# Patient Record
Sex: Male | Born: 1980
Health system: Southern US, Community
[De-identification: ages and names within clinical notes are randomized; demographics above are authoritative.]

## PROBLEM LIST (undated history)

## (undated) DIAGNOSIS — I10 Essential (primary) hypertension: Secondary | ICD-10-CM

## (undated) DIAGNOSIS — Z87442 Personal history of urinary calculi: Secondary | ICD-10-CM

## (undated) HISTORY — PX: CHOLECYSTECTOMY: SHX55

---

## 2009-12-26 ENCOUNTER — Emergency Department (HOSPITAL_COMMUNITY)
Admission: EM | Admit: 2009-12-26 | Discharge: 2009-12-26 | Payer: Self-pay | Source: Home / Self Care | Admitting: Emergency Medicine

## 2009-12-27 ENCOUNTER — Ambulatory Visit (HOSPITAL_BASED_OUTPATIENT_CLINIC_OR_DEPARTMENT_OTHER)
Admission: RE | Admit: 2009-12-27 | Discharge: 2009-12-27 | Payer: Self-pay | Source: Home / Self Care | Attending: Internal Medicine | Admitting: Internal Medicine

## 2009-12-28 ENCOUNTER — Ambulatory Visit (HOSPITAL_COMMUNITY)
Admission: RE | Admit: 2009-12-28 | Discharge: 2009-12-28 | Payer: Self-pay | Source: Home / Self Care | Attending: Internal Medicine | Admitting: Internal Medicine

## 2010-03-28 LAB — POCT CARDIAC MARKERS
CKMB, poc: <1 ng/mL — ABNORMAL LOW (ref 1.0–8.0)
CKMB, poc: <1 ng/mL — ABNORMAL LOW (ref 1.0–8.0)
Myoglobin, poc: 25.6 ng/mL (ref 12–200)
Myoglobin, poc: 39.7 ng/mL (ref 12–200)
Troponin i, poc: <0.05 ng/mL (ref 0.00–0.09)
Troponin i, poc: <0.05 ng/mL (ref 0.00–0.09)

## 2010-03-28 LAB — CBC
HCT: 47.3 % (ref 39.0–52.0)
Hemoglobin: 16.3 g/dL (ref 13.0–17.0)
MCV: 89.4 fL (ref 78.0–100.0)
Platelets: 332 10*3/uL (ref 150–400)
RBC: 5.29 MIL/uL (ref 4.22–5.81)
WBC: 7.2 10*3/uL (ref 4.0–10.5)

## 2010-03-28 LAB — URINALYSIS, ROUTINE W REFLEX MICROSCOPIC
Hgb urine dipstick: NEGATIVE
Protein, ur: NEGATIVE mg/dL
pH: 5 (ref 5.0–8.0)

## 2010-03-28 LAB — DIFFERENTIAL
Basophils Absolute: 0 10*3/uL (ref 0.0–0.1)
Basophils Relative: 0 % (ref 0–1)
Eosinophils Absolute: 0.1 10*3/uL (ref 0.0–0.7)
Eosinophils Relative: 1 % (ref 0–5)
Lymphocytes Relative: 34 % (ref 12–46)
Monocytes Absolute: 0.6 10*3/uL (ref 0.1–1.0)
Neutrophils Relative %: 56 % (ref 43–77)

## 2010-03-28 LAB — POCT I-STAT, CHEM 8
HCT: 50 % (ref 39.0–52.0)
Hemoglobin: 17 g/dL (ref 13.0–17.0)
Sodium: 142 mEq/L (ref 135–145)
TCO2: 27 mmol/L (ref 0–100)

## 2014-03-17 ENCOUNTER — Ambulatory Visit (HOSPITAL_BASED_OUTPATIENT_CLINIC_OR_DEPARTMENT_OTHER)
Admission: RE | Admit: 2014-03-17 | Discharge: 2014-03-17 | Disposition: A | Discharge status: Home / Self Care | Payer: 59 | Source: Ambulatory Visit | Attending: Internal Medicine | Admitting: Internal Medicine

## 2014-03-17 ENCOUNTER — Other Ambulatory Visit (HOSPITAL_BASED_OUTPATIENT_CLINIC_OR_DEPARTMENT_OTHER): Payer: Self-pay | Admitting: Internal Medicine

## 2014-03-17 DIAGNOSIS — R109 Unspecified abdominal pain: Secondary | ICD-10-CM

## 2014-03-17 DIAGNOSIS — R1011 Right upper quadrant pain: Secondary | ICD-10-CM | POA: Diagnosis present

## 2014-03-17 DIAGNOSIS — R110 Nausea: Secondary | ICD-10-CM | POA: Insufficient documentation

## 2016-02-03 DIAGNOSIS — L509 Urticaria, unspecified: Secondary | ICD-10-CM | POA: Diagnosis not present

## 2016-02-03 DIAGNOSIS — I10 Essential (primary) hypertension: Secondary | ICD-10-CM | POA: Diagnosis not present

## 2016-02-03 DIAGNOSIS — R5383 Other fatigue: Secondary | ICD-10-CM | POA: Diagnosis not present

## 2016-02-03 DIAGNOSIS — R5381 Other malaise: Secondary | ICD-10-CM | POA: Diagnosis not present

## 2016-03-06 DIAGNOSIS — L718 Other rosacea: Secondary | ICD-10-CM | POA: Diagnosis not present

## 2016-04-02 MED FILL — LOSARTAN POTASSIUM 50 MG TA: 50 | 90 days supply | Qty: 90 | Fill #0

## 2016-05-15 ENCOUNTER — Telehealth: Payer: 59 | Admitting: Family

## 2016-05-15 DIAGNOSIS — H1012 Acute atopic conjunctivitis, left eye: Secondary | ICD-10-CM

## 2016-05-15 MED ORDER — POLYMYXIN B-TRIMETHOPRIM 10000-0.1 UNIT/ML-% OP SOLN: 1.0000 [drp] | OPHTHALMIC | 0 refills | Status: DC

## 2016-05-15 NOTE — Progress Notes (Signed)

## 2016-05-24 DIAGNOSIS — H10812 Pingueculitis, left eye: Secondary | ICD-10-CM | POA: Diagnosis not present

## 2016-05-24 DIAGNOSIS — H40023 Open angle with borderline findings, high risk, bilateral: Secondary | ICD-10-CM | POA: Diagnosis not present

## 2016-05-24 DIAGNOSIS — H16292 Other keratoconjunctivitis, left eye: Secondary | ICD-10-CM | POA: Diagnosis not present

## 2016-05-24 MED FILL — TOBRAMYCIN-DEXAMETH OPTH SU: 0.3-0.1 | 25 days supply | Qty: 5 | Fill #0

## 2016-05-30 DIAGNOSIS — H40043 Steroid responder, bilateral: Secondary | ICD-10-CM | POA: Diagnosis not present

## 2016-05-30 DIAGNOSIS — H16292 Other keratoconjunctivitis, left eye: Secondary | ICD-10-CM | POA: Diagnosis not present

## 2016-07-05 DIAGNOSIS — H40043 Steroid responder, bilateral: Secondary | ICD-10-CM | POA: Diagnosis not present

## 2016-07-05 DIAGNOSIS — Z83511 Family history of glaucoma: Secondary | ICD-10-CM | POA: Diagnosis not present

## 2016-07-05 DIAGNOSIS — H40023 Open angle with borderline findings, high risk, bilateral: Secondary | ICD-10-CM | POA: Diagnosis not present

## 2016-07-05 DIAGNOSIS — H3589 Other specified retinal disorders: Secondary | ICD-10-CM | POA: Diagnosis not present

## 2016-09-12 MED FILL — LOSARTAN POTASSIUM 50 MG TA: 50 | 90 days supply | Qty: 90 | Fill #0

## 2017-01-21 ENCOUNTER — Telehealth: Payer: 59 | Admitting: Family

## 2017-01-21 DIAGNOSIS — B9689 Other specified bacterial agents as the cause of diseases classified elsewhere: Secondary | ICD-10-CM

## 2017-01-21 DIAGNOSIS — J028 Acute pharyngitis due to other specified organisms: Secondary | ICD-10-CM

## 2017-01-21 MED ORDER — BENZONATATE 100 MG PO CAPS: 100.0000 mg | ORAL_CAPSULE | Freq: Three times a day (TID) | ORAL | 0 refills | Status: DC | PRN

## 2017-01-21 MED ORDER — AZITHROMYCIN 250 MG PO TABS: ORAL_TABLET | ORAL | 0 refills | Status: DC

## 2017-01-21 MED FILL — AZITHROMYCIN 250 MG TAB: 250 | 5 days supply | Qty: 6 | Fill #0

## 2017-01-21 NOTE — Progress Notes (Signed)
Thank you for the details you included in the comment boxes. Those details are very helpful in determining the best course of treatment for you and help us to provide the best care.  We are sorry that you are not feeling well.  Here is how we plan to help!  Based on your presentation I believe you most likely have A cough due to bacteria.  When patients have a fever and a productive cough with a change in color or increased sputum production, we are concerned about bacterial bronchitis.  If left untreated it can progress to pneumonia.  If your symptoms do not improve with your treatment plan it is important that you contact your provider.   I have prescribed Azithromyin 250 mg: two tablets now and then one tablet daily for 4 additonal days    In addition you may use A non-prescription cough medication called Mucinex DM: take 2 tablets every 12 hours.    From your responses in the eVisit questionnaire you describe inflammation in the upper respiratory tract which is causing a significant cough.  This is commonly called Bronchitis and has four common causes:    Allergies  Viral Infections  Acid Reflux  Bacterial Infection Allergies, viruses and acid reflux are treated by controlling symptoms or eliminating the cause. An example might be a cough caused by taking certain blood pressure medications. You stop the cough by changing the medication. Another example might be a cough caused by acid reflux. Controlling the reflux helps control the cough.  USE OF BRONCHODILATOR ("RESCUE") INHALERS: There is a risk from using your bronchodilator too frequently.  The risk is that over-reliance on a medication which only relaxes the muscles surrounding the breathing tubes can reduce the effectiveness of medications prescribed to reduce swelling and congestion of the tubes themselves.  Although you feel brief relief from the bronchodilator inhaler, your asthma may actually be worsening with the tubes becoming  more swollen and filled with mucus.  This can delay other crucial treatments, such as oral steroid medications. If you need to use a bronchodilator inhaler daily, several times per day, you should discuss this with your provider.  There are probably better treatments that could be used to keep your asthma under control.     HOME CARE . Only take medications as instructed by your medical team. . Complete the entire course of an antibiotic. . Drink plenty of fluids and get plenty of rest. . Avoid close contacts especially the very young and the elderly . Cover your mouth if you cough or cough into your sleeve. . Always remember to wash your hands . A steam or ultrasonic humidifier can help congestion.   GET HELP RIGHT AWAY IF: . You develop worsening fever. . You become short of breath . You cough up blood. . Your symptoms persist after you have completed your treatment plan MAKE SURE YOU   Understand these instructions.  Will watch your condition.  Will get help right away if you are not doing well or get worse.  Your e-visit answers were reviewed by a board certified advanced clinical practitioner to complete your personal care plan.  Depending on the condition, your plan could have included both over the counter or prescription medications. If there is a problem please reply  once you have received a response from your provider. Your safety is important to us.  If you have drug allergies check your prescription carefully.    You can use MyChart to ask questions   about today's visit, request a non-urgent call back, or ask for a work or school excuse for 24 hours related to this e-Visit. If it has been greater than 24 hours you will need to follow up with your provider, or enter a new e-Visit to address those concerns. You will get an e-mail in the next two days asking about your experience.  I hope that your e-visit has been valuable and will speed your recovery. Thank you for using  e-visits.   

## 2017-02-20 ENCOUNTER — Telehealth: Payer: 59 | Admitting: Family

## 2017-02-20 DIAGNOSIS — J209 Acute bronchitis, unspecified: Secondary | ICD-10-CM

## 2017-02-20 MED ORDER — BENZONATATE 100 MG PO CAPS: 100.0000 mg | ORAL_CAPSULE | Freq: Three times a day (TID) | ORAL | 0 refills | Status: DC | PRN

## 2017-02-20 MED ORDER — PREDNISONE 10 MG (21) PO TBPK: ORAL_TABLET | ORAL | 0 refills | Status: DC

## 2017-02-20 MED FILL — BENZONATATE 100 MG CAP: 100 | 6 days supply | Qty: 20 | Fill #0

## 2017-02-20 MED FILL — predniSONE 10 MG (21) TBPK: 10 | 6 days supply | Qty: 21 | Fill #0

## 2017-02-20 NOTE — Progress Notes (Signed)
We are sorry that you are not feeling well.  Here is how we plan to help!  Based on your presentation I believe you most likely have A cough due to a virus.  This is called viral bronchitis and is best treated by rest, plenty of fluids and control of the cough.  You may use Ibuprofen or Tylenol as directed to help your symptoms.     In addition you may use A non-prescription cough medication called Robitussin DAC. Take 2 teaspoons every 8 hours or Delsym: take 2 teaspoons every 12 hours., A non-prescription cough medication called Mucinex DM: take 2 tablets every 12 hours. and A prescription cough medication called Tessalon Perles 100mg . You may take 1-2 capsules every 8 hours as needed for your cough.  I have also sent in a prescription Sterapred 10 mg dosepak.  From your responses in the eVisit questionnaire you describe inflammation in the upper respiratory tract which is causing a significant cough.  This is commonly called Bronchitis and has four common causes:    Allergies  Viral Infections  Acid Reflux  Bacterial Infection Allergies, viruses and acid reflux are treated by controlling symptoms or eliminating the cause. An example might be a cough caused by taking certain blood pressure medications. You stop the cough by changing the medication. Another example might be a cough caused by acid reflux. Controlling the reflux helps control the cough.  USE OF BRONCHODILATOR ("RESCUE") INHALERS: There is a risk from using your bronchodilator too frequently.  The risk is that over-reliance on a medication which only relaxes the muscles surrounding the breathing tubes can reduce the effectiveness of medications prescribed to reduce swelling and congestion of the tubes themselves.  Although you feel brief relief from the bronchodilator inhaler, your asthma may actually be worsening with the tubes becoming more swollen and filled with mucus.  This can delay other crucial treatments, such as oral  steroid medications. If you need to use a bronchodilator inhaler daily, several times per day, you should discuss this with your provider.  There are probably better treatments that could be used to keep your asthma under control.     HOME CARE . Only take medications as instructed by your medical team. . Complete the entire course of an antibiotic. . Drink plenty of fluids and get plenty of rest. . Avoid close contacts especially the very young and the elderly . Cover your mouth if you cough or cough into your sleeve. . Always remember to wash your hands . A steam or ultrasonic humidifier can help congestion.   GET HELP RIGHT AWAY IF: . You develop worsening fever. . You become short of breath . You cough up blood. . Your symptoms persist after you have completed your treatment plan MAKE SURE YOU   Understand these instructions.  Will watch your condition.  Will get help right away if you are not doing well or get worse.  Your e-visit answers were reviewed by a board certified advanced clinical practitioner to complete your personal care plan.  Depending on the condition, your plan could have included both over the counter or prescription medications. If there is a problem please reply  once you have received a response from your provider. Your safety is important to us.  If you have drug allergies check your prescription carefully.    You can use MyChart to ask questions about today's visit, request a non-urgent call back, or ask for a work or school excuse for 24 hours  related to this e-Visit. If it has been greater than 24 hours you will need to follow up with your provider, or enter a new e-Visit to address those concerns. You will get an e-mail in the next two days asking about your experience.  I hope that your e-visit has been valuable and will speed your recovery. Thank you for using e-visits.   

## 2017-03-15 ENCOUNTER — Telehealth: Payer: 59 | Admitting: Family

## 2017-03-15 DIAGNOSIS — J028 Acute pharyngitis due to other specified organisms: Secondary | ICD-10-CM

## 2017-03-15 DIAGNOSIS — B9689 Other specified bacterial agents as the cause of diseases classified elsewhere: Secondary | ICD-10-CM

## 2017-03-15 MED ORDER — BENZONATATE 100 MG PO CAPS: 100.0000 mg | ORAL_CAPSULE | Freq: Three times a day (TID) | ORAL | 0 refills | Status: DC | PRN

## 2017-03-15 MED ORDER — AZITHROMYCIN 250 MG PO TABS: ORAL_TABLET | ORAL | 0 refills | Status: DC

## 2017-03-15 MED FILL — BENZONATATE 100 MG CAP: 100 | 5 days supply | Qty: 30 | Fill #0

## 2017-03-15 MED FILL — AZITHROMYCIN 250 MG TABS: 250 | 5 days supply | Qty: 6 | Fill #0

## 2017-03-15 NOTE — Progress Notes (Signed)

## 2017-06-16 DIAGNOSIS — N3001 Acute cystitis with hematuria: Secondary | ICD-10-CM | POA: Diagnosis not present

## 2017-06-16 DIAGNOSIS — N341 Nonspecific urethritis: Secondary | ICD-10-CM | POA: Diagnosis not present

## 2017-06-21 DIAGNOSIS — Z0000 Encounter for general adult medical examination without abnormal findings: Secondary | ICD-10-CM | POA: Diagnosis not present

## 2017-06-21 DIAGNOSIS — R5381 Other malaise: Secondary | ICD-10-CM | POA: Diagnosis not present

## 2017-06-21 DIAGNOSIS — I10 Essential (primary) hypertension: Secondary | ICD-10-CM | POA: Diagnosis not present

## 2017-06-21 DIAGNOSIS — R5383 Other fatigue: Secondary | ICD-10-CM | POA: Diagnosis not present

## 2017-06-21 MED FILL — LISINOPRIL 10 MG TABS: 10 | 90 days supply | Qty: 90 | Fill #0

## 2017-06-30 ENCOUNTER — Emergency Department (HOSPITAL_COMMUNITY): Admission: EM | Admit: 2017-06-30 | Discharge: 2017-06-30 | Discharge status: Against Medical Advice | Payer: 59

## 2017-07-01 ENCOUNTER — Other Ambulatory Visit: Payer: Self-pay | Admitting: Urology

## 2017-07-01 ENCOUNTER — Encounter (HOSPITAL_COMMUNITY)
Admission: RE | Disposition: A | Discharge status: Home / Self Care | Payer: Self-pay | Source: Ambulatory Visit | Attending: Urology

## 2017-07-01 ENCOUNTER — Encounter (HOSPITAL_COMMUNITY): Payer: Self-pay

## 2017-07-01 ENCOUNTER — Ambulatory Visit (HOSPITAL_COMMUNITY): Payer: 59

## 2017-07-01 ENCOUNTER — Emergency Department (HOSPITAL_COMMUNITY): Payer: 59

## 2017-07-01 ENCOUNTER — Ambulatory Visit (HOSPITAL_COMMUNITY)
Admission: RE | Admit: 2017-07-01 | Discharge: 2017-07-01 | Disposition: A | Discharge status: Home / Self Care | Payer: 59 | Source: Ambulatory Visit | Attending: Urology | Admitting: Urology

## 2017-07-01 ENCOUNTER — Other Ambulatory Visit: Payer: Self-pay

## 2017-07-01 ENCOUNTER — Emergency Department (HOSPITAL_COMMUNITY)
Admission: EM | Admit: 2017-07-01 | Discharge: 2017-07-01 | Disposition: A | Discharge status: Home / Self Care | Payer: 59 | Source: Home / Self Care | Attending: Emergency Medicine | Admitting: Emergency Medicine

## 2017-07-01 ENCOUNTER — Encounter (HOSPITAL_COMMUNITY): Payer: Self-pay | Admitting: Urology

## 2017-07-01 DIAGNOSIS — N132 Hydronephrosis with renal and ureteral calculous obstruction: Secondary | ICD-10-CM | POA: Insufficient documentation

## 2017-07-01 DIAGNOSIS — N200 Calculus of kidney: Secondary | ICD-10-CM | POA: Diagnosis not present

## 2017-07-01 DIAGNOSIS — N135 Crossing vessel and stricture of ureter without hydronephrosis: Secondary | ICD-10-CM

## 2017-07-01 DIAGNOSIS — R112 Nausea with vomiting, unspecified: Secondary | ICD-10-CM | POA: Diagnosis not present

## 2017-07-01 DIAGNOSIS — N201 Calculus of ureter: Secondary | ICD-10-CM | POA: Diagnosis not present

## 2017-07-01 DIAGNOSIS — R319 Hematuria, unspecified: Secondary | ICD-10-CM

## 2017-07-01 DIAGNOSIS — Z79899 Other long term (current) drug therapy: Secondary | ICD-10-CM | POA: Insufficient documentation

## 2017-07-01 DIAGNOSIS — R109 Unspecified abdominal pain: Secondary | ICD-10-CM | POA: Diagnosis not present

## 2017-07-01 HISTORY — DX: Personal history of urinary calculi: Z87.442

## 2017-07-01 HISTORY — DX: Essential (primary) hypertension: I10

## 2017-07-01 HISTORY — PX: EXTRACORPOREAL SHOCK WAVE LITHOTRIPSY: SHX1557

## 2017-07-01 LAB — URINALYSIS, ROUTINE W REFLEX MICROSCOPIC
BILIRUBIN URINE: NEGATIVE
GLUCOSE, UA: NEGATIVE mg/dL
KETONES UR: NEGATIVE mg/dL
LEUKOCYTES UA: NEGATIVE
NITRITE: NEGATIVE
PROTEIN: 30 mg/dL — AB
RBC / HPF: >50 RBC/hpf — ABNORMAL HIGH (ref 0–5)
Specific Gravity, Urine: 1.021 (ref 1.005–1.030)
pH: 5 (ref 5.0–8.0)

## 2017-07-01 LAB — CBC WITH DIFFERENTIAL/PLATELET
BASOS ABS: 0 10*3/uL (ref 0.0–0.1)
Basophils Relative: 0 %
EOS ABS: 0 10*3/uL (ref 0.0–0.7)
EOS PCT: 0 %
HCT: 48.1 % (ref 39.0–52.0)
Hemoglobin: 16.5 g/dL (ref 13.0–17.0)
Lymphocytes Relative: 9 %
Lymphs Abs: 1.1 10*3/uL (ref 0.7–4.0)
MCH: 30.6 pg (ref 26.0–34.0)
MCHC: 34.3 g/dL (ref 30.0–36.0)
MCV: 89.2 fL (ref 78.0–100.0)
MONO ABS: 0.6 10*3/uL (ref 0.1–1.0)
Monocytes Relative: 5 %
Neutro Abs: 10.3 10*3/uL — ABNORMAL HIGH (ref 1.7–7.7)
Neutrophils Relative %: 86 %
PLATELETS: 342 10*3/uL (ref 150–400)
RBC: 5.39 MIL/uL (ref 4.22–5.81)
RDW: 13.6 % (ref 11.5–15.5)
WBC: 12.1 10*3/uL — ABNORMAL HIGH (ref 4.0–10.5)

## 2017-07-01 LAB — BASIC METABOLIC PANEL
ANION GAP: 9 (ref 5–15)
BUN: 12 mg/dL (ref 6–20)
CALCIUM: 9.2 mg/dL (ref 8.9–10.3)
CO2: 26 mmol/L (ref 22–32)
Chloride: 106 mmol/L (ref 101–111)
Creatinine, Ser: 0.99 mg/dL (ref 0.61–1.24)
GFR calc Af Amer: >60 mL/min (ref >60)
Glucose, Bld: 107 mg/dL — ABNORMAL HIGH (ref 65–99)
Potassium: 4.3 mmol/L (ref 3.5–5.1)
Sodium: 141 mmol/L (ref 135–145)

## 2017-07-01 SURGERY — LITHOTRIPSY, ESWL
Anesthesia: LOCAL | Laterality: Left

## 2017-07-01 MED ORDER — HYDROMORPHONE HCL 1 MG/ML IJ SOLN
1.0000 mg | Freq: Once | INTRAMUSCULAR | Status: DC
  Filled 2017-07-01: qty 1

## 2017-07-01 MED ORDER — DIAZEPAM 5 MG PO TABS
10.0000 mg | ORAL_TABLET | ORAL | Status: AC
  Administered 2017-07-01: 10 mg via ORAL
  Filled 2017-07-01: qty 2

## 2017-07-01 MED ORDER — ONDANSETRON 4 MG PO TBDP: 4.0000 mg | ORAL_TABLET | Freq: Three times a day (TID) | ORAL | 0 refills | Status: DC | PRN

## 2017-07-01 MED ORDER — ONDANSETRON HCL 4 MG/2ML IJ SOLN
4.0000 mg | Freq: Once | INTRAMUSCULAR | Status: DC
  Filled 2017-07-01: qty 2

## 2017-07-01 MED ORDER — CEPHALEXIN 500 MG PO CAPS: 500.0000 mg | ORAL_CAPSULE | Freq: Three times a day (TID) | ORAL | 0 refills | Status: AC

## 2017-07-01 MED ORDER — OXYCODONE-ACETAMINOPHEN 5-325 MG PO TABS: 1.0000 | ORAL_TABLET | ORAL | 0 refills | Status: DC | PRN

## 2017-07-01 MED ORDER — KETOROLAC TROMETHAMINE 30 MG/ML IJ SOLN
30.0000 mg | Freq: Once | INTRAMUSCULAR | Status: DC
  Filled 2017-07-01: qty 1

## 2017-07-01 MED ORDER — KETOROLAC TROMETHAMINE 15 MG/ML IJ SOLN
INTRAMUSCULAR | Status: AC
  Filled 2017-07-01: qty 1

## 2017-07-01 MED ORDER — SODIUM CHLORIDE 0.9 % IV BOLUS
1000.0000 mL | Freq: Once | INTRAVENOUS | Status: AC
  Administered 2017-07-01: 1000 mL via INTRAVENOUS

## 2017-07-01 MED ORDER — CIPROFLOXACIN HCL 500 MG PO TABS
500.0000 mg | ORAL_TABLET | ORAL | Status: AC
  Administered 2017-07-01: 500 mg via ORAL
  Filled 2017-07-01: qty 1

## 2017-07-01 MED ORDER — DIPHENHYDRAMINE HCL 25 MG PO CAPS
25.0000 mg | ORAL_CAPSULE | ORAL | Status: AC
  Administered 2017-07-01: 25 mg via ORAL
  Filled 2017-07-01: qty 1

## 2017-07-01 MED ORDER — TAMSULOSIN HCL 0.4 MG PO CAPS: 0.4000 mg | ORAL_CAPSULE | Freq: Every day | ORAL | 0 refills | Status: AC

## 2017-07-01 MED ORDER — HYDROMORPHONE HCL 1 MG/ML IJ SOLN: 0.5000 mg | INTRAMUSCULAR | Status: DC | PRN

## 2017-07-01 MED ORDER — KETOROLAC TROMETHAMINE 15 MG/ML IJ SOLN
15.0000 mg | Freq: Four times a day (QID) | INTRAMUSCULAR | Status: DC | PRN
  Administered 2017-07-01: 15 mg via INTRAVENOUS

## 2017-07-01 MED ORDER — SODIUM CHLORIDE 0.9 % IV SOLN
INTRAVENOUS | Status: DC
  Administered 2017-07-01: 17:00:00 via INTRAVENOUS

## 2017-07-01 MED FILL — TAMSULOSIN HCL 0.4 MG CAP: 0.4 | 10 days supply | Qty: 10 | Fill #0

## 2017-07-01 MED FILL — ONDANSETRON ODT 4 MG TABLET: 4 | 7 days supply | Qty: 20 | Fill #0

## 2017-07-01 MED FILL — OXYCODONE-ACETAMINOPHEN 5-3: 5-325 | 3 days supply | Qty: 15 | Fill #0

## 2017-07-01 NOTE — ED Notes (Signed)
Pt elected to hold off on all meds due to feeling better. He is aware to ask if he needs the medicine

## 2017-07-01 NOTE — ED Triage Notes (Signed)
Left flank pain since yesterday and came and left ER without being seen due to pain getting better now pt diaphoretic and pale with nausea and severe pain.

## 2017-07-01 NOTE — ED Provider Notes (Signed)
Patient sent to me by Dr. Manus Gunningrancour to follow-up on patient's renal function which is within normal limits.  Will discharge at this time and prescriptions written by Dr. Manus Gunningancour given   Lorre NickAllen, Tymeir Weathington, MD 07/01/17 [phone]

## 2017-07-01 NOTE — ED Notes (Signed)
Urine culture sent down with UA. 

## 2017-07-01 NOTE — ED Notes (Signed)
Bed: [id number] Expected date:  Expected time:  Means of arrival:  Comments: Hold Bartel

## 2017-07-01 NOTE — Discharge Instructions (Signed)
Take the pain medication nausea medication as prescribed.  Follow-up with the urologist.  Return to the ED if you develop worsening pain, fever, vomiting or other concerns.

## 2017-07-01 NOTE — ED Provider Notes (Signed)
[facility]-EMERGENCY DEPT Provider Note   CSN: [id number] Arrival date & time: 07/01/17  0453     History   Chief Complaint Chief Complaint  Patient presents with  . Flank Pain    HPI Mark Meyer is a 37 y.o. male.  Patient presents with left-sided abdominal pain associated with nausea and vomiting.  States he was here last night but left without being seen once he felt better.  Pain returned around 4 AM is associated with one episode of vomiting as well as hematuria and "chunks" in his urine.He was seen several weeks ago at an urgent care and given Cipro for a questionable UTI.  Denies any history of kidney stones.  No fever.  No pain in his testicles.  No diarrhea or constipation.  No previous abdominal surgeries.  The history is provided by the patient.  Flank Pain  Associated symptoms include abdominal pain. Pertinent negatives include no chest pain, no headaches and no shortness of breath.    No past medical history on file.  There are no active problems to display for this patient.         Home Medications    Prior to Admission medications   Medication Sig Start Date End Date Taking? Authorizing Provider  azithromycin (ZITHROMAX) 250 MG tablet Take 2 tabs now then 1 daily times4 days 01/21/17   Withrow, Everardo All, FNP  azithromycin (ZITHROMAX) 250 MG tablet Take 2 tabs now then 1 daily times 4 days 03/15/17   Withrow, Everardo All, FNP  benzonatate (TESSALON PERLES) 100 MG capsule Take 1 capsule (100 mg total) by mouth 3 (three) times daily as needed. 02/20/17   Jannifer Rodney A, FNP  benzonatate (TESSALON PERLES) 100 MG capsule Take 1-2 capsules (100-200 mg total) by mouth every 8 (eight) hours as needed for cough. 03/15/17   Withrow, Everardo All, FNP  predniSONE (STERAPRED UNI-PAK 21 TAB) 10 MG (21) TBPK tablet Use as directed 02/20/17   Jannifer Rodney A, FNP  trimethoprim-polymyxin b (POLYTRIM) ophthalmic solution Place 1 drop into the left eye every 4 (four)  hours. 05/15/16   Eulis Foster, FNP    Family History No family history on file.  Social History Social History   Tobacco Use  . Smoking status: Never Smoker  . Smokeless tobacco: Never Used  Substance Use Topics  . Alcohol use: Never    Frequency: Never  . Drug use: Never     Allergies   Sulfa antibiotics   Review of Systems Review of Systems  Constitutional: Negative for activity change, appetite change and fever.  HENT: Negative for rhinorrhea.   Respiratory: Negative for chest tightness and shortness of breath.   Cardiovascular: Negative for chest pain.  Gastrointestinal: Positive for abdominal pain, nausea and vomiting.  Genitourinary: Positive for dysuria, flank pain and hematuria.  Musculoskeletal: Negative for arthralgias and back pain.  Skin: Negative for rash.  Neurological: Negative for dizziness, weakness, light-headedness and headaches.    all other systems are negative except as noted in the HPI and PMH.    Physical Exam Updated Vital Signs BP (!) 154/83 (BP Location: Right Arm)   Pulse 79   Temp 98 F (36.7 C) (Oral)   Resp 18   Ht 6' (1.829 m)   Wt 115.7 kg (255 lb)   SpO2 98%   BMI 34.58 kg/m   Physical Exam  Constitutional: He is oriented to person, place, and time. He appears well-developed and well-nourished. No distress.  HENT:  Head: Normocephalic and atraumatic.  Mouth/Throat: Oropharynx is clear and moist. No oropharyngeal exudate.  Eyes: Pupils are equal, round, and reactive to light. Conjunctivae and EOM are normal.  Neck: Normal range of motion. Neck supple.  No meningismus.  Cardiovascular: Normal rate, regular rhythm, normal heart sounds and intact distal pulses.  No murmur heard. Pulmonary/Chest: Effort normal and breath sounds normal. No respiratory distress.  Abdominal: Soft. There is tenderness. There is no rebound and no guarding.  L sided abdominal tenderness  Genitourinary:  Genitourinary Comments: No testicular  pain  Musculoskeletal: Normal range of motion. He exhibits no edema or tenderness.  NO CVAT  Neurological: He is alert and oriented to person, place, and time. No cranial nerve deficit. He exhibits normal muscle tone. Coordination normal.  No ataxia on finger to nose bilaterally. No pronator drift. 5/5 strength throughout. CN 2-12 intact.Equal grip strength. Sensation intact.   Skin: Skin is warm.  Psychiatric: He has a normal mood and affect. His behavior is normal.  Nursing note and vitals reviewed.    ED Treatments / Results  Labs (all labs ordered are listed, but only abnormal results are displayed) Labs Reviewed  URINALYSIS, ROUTINE W REFLEX MICROSCOPIC - Abnormal; Notable for the following components:      Result Value   APPearance HAZY (*)    Hgb urine dipstick LARGE (*)    Protein, ur 30 (*)    RBC / HPF >50 (*)    Bacteria, UA FEW (*)    All other components within normal limits  CBC WITH DIFFERENTIAL/PLATELET - Abnormal; Notable for the following components:   WBC 12.1 (*)    Neutro Abs 10.3 (*)    All other components within normal limits  URINE CULTURE  BASIC METABOLIC PANEL    EKG None  Radiology Ct Renal Stone Study  Result Date: 07/01/2017 CLINICAL DATA:  Left flank pain with nausea and microhematuria. History of cholecystectomy. EXAM: CT ABDOMEN AND PELVIS WITHOUT CONTRAST TECHNIQUE: Multidetector CT imaging of the abdomen and pelvis was performed following the standard protocol without IV contrast. COMPARISON:  Abdominal ultrasound 03/17/2014. FINDINGS: Lower chest: Clear lung bases. No significant pleural or pericardial effusion. Hepatobiliary: Decreased hepatic density consistent with steatosis. No focal lesions identified on noncontrast imaging. There is no significant biliary dilatation status post interval cholecystectomy. Pancreas: Unremarkable. No pancreatic ductal dilatation or surrounding inflammatory changes. Spleen: Normal in size without focal  abnormality. Adrenals/Urinary Tract: Both adrenal glands appear normal. There is mild to moderate left-sided hydronephrosis and hydroureter secondary to an obstructing 6 mm calculus at the left ureterovesical junction. This is visible on the scout image. There is mild perinephric soft tissue stranding on the left. The kidneys otherwise appear normal without additional urinary tract calculi. The bladder appears normal. Stomach/Bowel: No evidence of bowel wall thickening, distention or surrounding inflammatory change. Mild distal colonic diverticulosis. The appendix appears normal. Vascular/Lymphatic: There are no enlarged abdominal or pelvic lymph nodes. No significant vascular findings on noncontrast imaging. Reproductive: The prostate gland and seminal vesicles appear normal. Other: No evidence of abdominal wall mass or hernia. No ascites. Musculoskeletal: No acute or significant osseous findings. IMPRESSION: 1. Obstructing 6 mm calculus at the left ureterovesical junction with associated mild to moderate hydronephrosis and hydroureter. 2. No other urinary tract calculi. 3. Hepatic steatosis. Electronically Signed   By: Carey BullocksWilliam  Veazey M.D.   On: 07/01/2017 07:23    Procedures Procedures (including critical care time)  Medications Ordered in ED Medications  sodium chloride 0.9 %  bolus 1,000 mL (has no administration in time range)  ondansetron (ZOFRAN) injection 4 mg (has no administration in time range)  HYDROmorphone (DILAUDID) injection 1 mg (has no administration in time range)  ketorolac (TORADOL) 30 MG/ML injection 30 mg (has no administration in time range)     Initial Impression / Assessment and Plan / ED Course  I have reviewed the triage vital signs and the nursing notes.  Pertinent labs & imaging results that were available during my care of the patient were reviewed by me and considered in my medical decision making (see chart for details).     Flank pain with hematuria, nausea and  vomiting.  Suspect kidney stone.  No fever  Patient given IV fluids.  He refuses pain medications as he is feeling better.  Urinalysis shows hematuria without evidence of infection.  Culture sent.  CT scan shows 6 mm UVJ stone with hydronephrosis.  Awaiting Chemistry and creatinine. Patient declines any pain medication at this time. Anticipate follow-up with urology.  Dr. Freida Busman to disposition after Labs result.  Final Clinical Impressions(s) / ED Diagnoses   Final diagnoses:  Kidney stone    ED Discharge Orders    None       Casimer Russett, Jeannett Senior, MD 07/01/17 [phone]

## 2017-07-01 NOTE — H&P (Signed)
Urology Preoperative H&P   Chief Complaint: Left flank pain  History of Present Illness: Mark Meyer is a 37 y.o. male with a 24 hour history of left-sided flank pain that radiates into the left inguinal area and is associated with nausea/vomiting. He was evaluated at the [facility] emergency department earlier this morning and had a CT stone study demonstrated a 5.5 mm left UVJ calculus with hydronephrosis. He also notes that he was evaluated approximately 3 weeks ago at an urgent care clinic for similar symptoms and was given a prescription for Cipro, which partially alleviated his discomfort. Has no prior history of nephrolithiasis. He is currently urinating without difficulty denies nausea/vomiting, fever/chills and stated left-sided flank pain has improved, but is still dull.   PSHx: Cholecystectomy  PMHx : None History reviewed. No pertinent past medical history.    Allergies:  Allergies  Allergen Reactions  . Sulfa Antibiotics     History reviewed. No pertinent family history.  PFHx: no sig family hx  Social History:  reports that he has never smoked. He has never used smokeless tobacco. He reports that he does not drink alcohol or use drugs.  ROS: A complete review of systems was performed.  All systems are negative except for pertinent findings as noted.  Physical Exam:  Vital signs in last 24 hours: Temp:  [98 F (36.7 C)] 98 F (36.7 C) (06/17 0517) Pulse Rate:  [78-82] 82 (06/17 0800) Resp:  [15-18] 15 (06/17 0756) BP: (136-162)/(83-103) 136/90 (06/17 0800) SpO2:  [95 %-98 %] 95 % (06/17 0800) Weight:  [115.7 kg (255 lb)] 115.7 kg (255 lb) (06/17 0535) Constitutional:  Alert and oriented, No acute distress Cardiovascular: Regular rate and rhythm, No JVD Respiratory: Normal respiratory effort, Lungs clear bilaterally GI: Abdomen is soft, nontender, nondistended, no abdominal masses GU: No CVA tenderness Lymphatic: No lymphadenopathy Neurologic: Grossly intact, no  focal deficits Psychiatric: Normal mood and affect  Laboratory Data:  Recent Labs    07/01/17 0749  WBC 12.1*  HGB 16.5  HCT 48.1  PLT 342    Recent Labs    07/01/17 0749  NA 141  K 4.3  CL 106  GLUCOSE 107*  BUN 12  CALCIUM 9.2  CREATININE 0.99     Results for orders placed or performed during the hospital encounter of 07/01/17 (from the past 24 hour(s))  Urinalysis, Routine w reflex microscopic- may I&O cath if menses     Status: Abnormal   Collection Time: 07/01/17  5:48 AM  Result Value Ref Range   Color, Urine YELLOW YELLOW   APPearance HAZY (A) CLEAR   Specific Gravity, Urine 1.021 1.005 - 1.030   pH 5.0 5.0 - 8.0   Glucose, UA NEGATIVE NEGATIVE mg/dL   Hgb urine dipstick LARGE (A) NEGATIVE   Bilirubin Urine NEGATIVE NEGATIVE   Ketones, ur NEGATIVE NEGATIVE mg/dL   Protein, ur 30 (A) NEGATIVE mg/dL   Nitrite NEGATIVE NEGATIVE   Leukocytes, UA NEGATIVE NEGATIVE   RBC / HPF >50 (H) 0 - 5 RBC/hpf   WBC, UA 11-20 0 - 5 WBC/hpf   Bacteria, UA FEW (A) NONE SEEN   Mucus PRESENT   CBC with Differential/Platelet     Status: Abnormal   Collection Time: 07/01/17  7:49 AM  Result Value Ref Range   WBC 12.1 (H) 4.0 - 10.5 K/uL   RBC 5.39 4.22 - 5.81 MIL/uL   Hemoglobin 16.5 13.0 - 17.0 g/dL   HCT 16.1 09.6 - 04.5 %  MCV 89.2 78.0 - 100.0 fL   MCH 30.6 26.0 - 34.0 pg   MCHC 34.3 30.0 - 36.0 g/dL   RDW 16.113.6 09.611.5 - 04.515.5 %   Platelets 342 150 - 400 K/uL   Neutrophils Relative % 86 %   Neutro Abs 10.3 (H) 1.7 - 7.7 K/uL   Lymphocytes Relative 9 %   Lymphs Abs 1.1 0.7 - 4.0 K/uL   Monocytes Relative 5 %   Monocytes Absolute 0.6 0.1 - 1.0 K/uL   Eosinophils Relative 0 %   Eosinophils Absolute 0.0 0.0 - 0.7 K/uL   Basophils Relative 0 %   Basophils Absolute 0.0 0.0 - 0.1 K/uL  Basic metabolic panel     Status: Abnormal   Collection Time: 07/01/17  7:49 AM  Result Value Ref Range   Sodium 141 135 - 145 mmol/L   Potassium 4.3 3.5 - 5.1 mmol/L   Chloride 106 101  - 111 mmol/L   CO2 26 22 - 32 mmol/L   Glucose, Bld 107 (H) 65 - 99 mg/dL   BUN 12 6 - 20 mg/dL   Creatinine, Ser 4.090.99 0.61 - 1.24 mg/dL   Calcium 9.2 8.9 - 81.110.3 mg/dL   GFR calc non Af Amer >60 >60 mL/min   GFR calc Af Amer >60 >60 mL/min   Anion gap 9 5 - 15   No results found for this or any previous visit (from the past 240 hour(s)).  Renal Function: Recent Labs    07/01/17 0749  CREATININE 0.99   Estimated Creatinine Clearance: 134.1 mL/min (by C-G formula based on SCr of 0.99 mg/dL).  Radiologic Imaging: Ct Renal Stone Study  Result Date: 07/01/2017 CLINICAL DATA:  Left flank pain with nausea and microhematuria. History of cholecystectomy. EXAM: CT ABDOMEN AND PELVIS WITHOUT CONTRAST TECHNIQUE: Multidetector CT imaging of the abdomen and pelvis was performed following the standard protocol without IV contrast. COMPARISON:  Abdominal ultrasound 03/17/2014. FINDINGS: Lower chest: Clear lung bases. No significant pleural or pericardial effusion. Hepatobiliary: Decreased hepatic density consistent with steatosis. No focal lesions identified on noncontrast imaging. There is no significant biliary dilatation status post interval cholecystectomy. Pancreas: Unremarkable. No pancreatic ductal dilatation or surrounding inflammatory changes. Spleen: Normal in size without focal abnormality. Adrenals/Urinary Tract: Both adrenal glands appear normal. There is mild to moderate left-sided hydronephrosis and hydroureter secondary to an obstructing 6 mm calculus at the left ureterovesical junction. This is visible on the scout image. There is mild perinephric soft tissue stranding on the left. The kidneys otherwise appear normal without additional urinary tract calculi. The bladder appears normal. Stomach/Bowel: No evidence of bowel wall thickening, distention or surrounding inflammatory change. Mild distal colonic diverticulosis. The appendix appears normal. Vascular/Lymphatic: There are no enlarged  abdominal or pelvic lymph nodes. No significant vascular findings on noncontrast imaging. Reproductive: The prostate gland and seminal vesicles appear normal. Other: No evidence of abdominal wall mass or hernia. No ascites. Musculoskeletal: No acute or significant osseous findings. IMPRESSION: 1. Obstructing 6 mm calculus at the left ureterovesical junction with associated mild to moderate hydronephrosis and hydroureter. 2. No other urinary tract calculi. 3. Hepatic steatosis. Electronically Signed   By: Carey BullocksWilliam  Veazey M.D.   On: 07/01/2017 07:23    I independently reviewed the above imaging studies.  Assessment and Plan Mark Meyer is a 37 y.o. male with a 6 mm left UVJ stone   The risks, benefits and alternatives of LEFT ESWL was discussed with the patient. I described the risks which  include arrhythmia, kidney contusion, kidney hemorrhage, need for transfusion, back discomfort, flank ecchymosis, flank abrasion, inability to break up stone, inability to pass stone fragments, Steinstrasse, infection associated with obstructing stones, need for different surgical procedure and possible need for repeat shockwave lithotripsy. The patient voices understanding and wishes to proceed.  Rhoderick Moody, MD 07/01/2017, 4:21 PM  [facility] Pager: [phone]

## 2017-07-02 ENCOUNTER — Encounter (HOSPITAL_COMMUNITY): Payer: Self-pay | Admitting: Urology

## 2017-07-02 LAB — URINE CULTURE: Culture: NO GROWTH

## 2017-07-02 NOTE — Op Note (Signed)
[facility] Operative Note  Treating Physician: Delmy Holdren, MD  Pre-op diagnosis: 6 mm left UVJ stone  Post-op diagnosis: Same   Procedure: Left ESWL  See [facility] OP note scanned into chart. Also because of the size, density, location and other factors that cannot be anticipated I feel this will likely be a staged procedure. This fact supersedes any indication in the scanned [facility] operative note to the contrary    

## 2017-07-22 MED FILL — LISINOPRIL 10 MG TABLET: 10 | 90 days supply | Qty: 90 | Fill #1

## 2017-07-24 DIAGNOSIS — N201 Calculus of ureter: Secondary | ICD-10-CM | POA: Diagnosis not present

## 2017-08-02 DIAGNOSIS — N201 Calculus of ureter: Secondary | ICD-10-CM | POA: Diagnosis not present

## 2017-11-04 DIAGNOSIS — H52223 Regular astigmatism, bilateral: Secondary | ICD-10-CM | POA: Diagnosis not present

## 2017-12-03 DIAGNOSIS — Z83511 Family history of glaucoma: Secondary | ICD-10-CM | POA: Diagnosis not present

## 2017-12-03 DIAGNOSIS — H40043 Steroid responder, bilateral: Secondary | ICD-10-CM | POA: Diagnosis not present

## 2017-12-03 DIAGNOSIS — H40023 Open angle with borderline findings, high risk, bilateral: Secondary | ICD-10-CM | POA: Diagnosis not present

## 2017-12-16 MED FILL — LISINOPRIL 10 MG TABS: 10 | 90 days supply | Qty: 90 | Fill #2

## 2018-02-06 ENCOUNTER — Ambulatory Visit: Payer: 59 | Admitting: Family Medicine

## 2018-02-06 ENCOUNTER — Ambulatory Visit: Payer: Self-pay

## 2018-02-06 ENCOUNTER — Encounter: Payer: Self-pay | Admitting: Family Medicine

## 2018-02-06 VITALS — BP 110/82 | HR 94 | Ht 72.0 in | Wt 264.0 lb

## 2018-02-06 DIAGNOSIS — S46812A Strain of other muscles, fascia and tendons at shoulder and upper arm level, left arm, initial encounter: Secondary | ICD-10-CM | POA: Diagnosis not present

## 2018-02-06 DIAGNOSIS — M25512 Pain in left shoulder: Secondary | ICD-10-CM

## 2018-02-06 MED ORDER — NITROGLYCERIN 0.2 MG/HR TD PT24: MEDICATED_PATCH | TRANSDERMAL | 1 refills | Status: DC

## 2018-02-06 MED FILL — NITROGLYCERIN 0.2 MG/HR PTC: 0.2 | 28 days supply | Qty: 7 | Fill #0

## 2018-02-06 NOTE — Assessment & Plan Note (Signed)
Small tear noted on the posterior aspect of the shoulder that looks to be more of the infraspinatus.  Due to patient's body habitus difficult to see the labrum but also a possibility.  Encourage patient do home exercises, started nitroglycerin, and discussed icing regimen and home exercises.  Discussed topical anti-inflammatories.  Follow-up again in 4 to 6 weeks

## 2018-02-06 NOTE — Patient Instructions (Addendum)
Good to see you.   Ice 20 minutes 2 times daily. Usually after activity and before bed.  pennsaid pinkie amount topically 2 times daily as needed.   Keep hand within peripheral vision.  Exercises 3 times a week.   Nitroglycerin Protocol   Apply 1/4 nitroglycerin patch to affected area daily.  Change position of patch within the affected area every 24 hours.  You may experience a headache during the first 1-2 weeks of using the patch, these should subside.  If you experience headaches after beginning nitroglycerin patch treatment, you may take your preferred over the counter pain reliever.  Another side effect of the nitroglycerin patch is skin irritation or rash related to patch adhesive.  Please notify our office if you develop more severe headaches or rash, and stop the patch.  Tendon healing with nitroglycerin patch may require 12 to 24 weeks depending on the extent of injury.  Men should not use if taking Viagra, Cialis, or Levitra.   Do not use if you have migraines or rosacea.  Tart cherry extract any dose at night for possible gout or oxalate crystals See me again in 4-6 weeks

## 2018-02-06 NOTE — Progress Notes (Signed)
Tawana ScaleZach Jaeceon Meyer D.O. [facility] [address] [place], [place] [address] Phone: [phone] Subjective:   Mark Meyer, Mark Meyer, am serving as a scribe for Dr. Antoine PrimasZachary Hartlyn Meyer.   CC:  Left shoulder pain   OZH:[id number]:Subjective  Mark EdelsonBrian P Meyer is a 38 y.o. male coming in with complaint of left shoulder pain, posterior aspect. Patient just started lifting to try to lose weight and developed pain. Incline bench and military press increase his pain. Denies any radiating symptoms. Patient notes a "pinch" in that area and a clicking when performing exercises in the gym. Does not take any medication for pain.   Onset-  Location Duration-  Character- Aggravating factors- Reliving factors-  Therapies tried-  Severity-     Past Medical History:  Diagnosis Date  . History of kidney stones   . Hypertension    Past Surgical History:  Procedure Laterality Date  . CHOLECYSTECTOMY    . EXTRACORPOREAL SHOCK WAVE LITHOTRIPSY Left 07/01/2017   Procedure: LEFT EXTRACORPOREAL SHOCK WAVE LITHOTRIPSY ([facility]);  Surgeon: Rene PaciWinter, Christopher Aaron, MD;  Location: [facility];  Service: Urology;  Laterality: Left;   Social History   Socioeconomic History  . Marital status: Married    Spouse name: Not on file  . Number of children: Not on file  . Years of education: Not on file  . Highest education level: Not on file  Occupational History  . Not on file  Social Needs  . Financial resource strain: Not on file  . Food insecurity:    Worry: Not on file    Inability: Not on file  . Transportation needs:    Medical: Not on file    Non-medical: Not on file  Tobacco Use  . Smoking status: Never Smoker  . Smokeless tobacco: Never Used  Substance and Sexual Activity  . Alcohol use: Never    Frequency: Never  . Drug use: Never  . Sexual activity: Not on file  Lifestyle  . Physical activity:    Days per week: Not on file    Minutes per session: Not on file  . Stress: Not on file  Relationships  .  Social connections:    Talks on phone: Not on file    Gets together: Not on file    Attends religious service: Not on file    Active member of club or organization: Not on file    Attends meetings of clubs or organizations: Not on file    Relationship status: Not on file  Other Topics Concern  . Not on file  Social History Narrative  . Not on file   Allergies  Allergen Reactions  . Sulfa Antibiotics    No family history on file.  Current Outpatient Medications (Endocrine & Metabolic):  .  predniSONE (STERAPRED UNI-PAK 21 TAB) 10 MG (21) TBPK tablet, Use as directed  Current Outpatient Medications (Cardiovascular):  .  lisinopril (PRINIVIL,ZESTRIL) 10 MG tablet, Take 5 mg by mouth daily. .  nitroGLYCERIN (NITRODUR - DOSED IN MG/24 HR) 0.2 mg/hr patch, 1/4 patch daily  Current Outpatient Medications (Respiratory):  .  benzonatate (TESSALON PERLES) 100 MG capsule, Take 1 capsule (100 mg total) by mouth 3 (three) times daily as needed. .  benzonatate (TESSALON PERLES) 100 MG capsule, Take 1-2 capsules (100-200 mg total) by mouth every 8 (eight) hours as needed for cough.  Current Outpatient Medications (Analgesics):  .  oxyCODONE-acetaminophen (PERCOCET/ROXICET) 5-325 MG tablet, Take 1 tablet by mouth every 4 (four) hours as needed for severe  pain.   Current Outpatient Medications (Other):  .  azithromycin (ZITHROMAX) 250 MG tablet, Take 2 tabs now then 1 daily times4 days .  azithromycin (ZITHROMAX) 250 MG tablet, Take 2 tabs now then 1 daily times 4 days .  ondansetron (ZOFRAN ODT) 4 MG disintegrating tablet, Take 1 tablet (4 mg total) by mouth every 8 (eight) hours as needed for nausea or vomiting. .  tamsulosin (FLOMAX) 0.4 MG CAPS capsule, Take 1 capsule (0.4 mg total) by mouth daily. Marland Kitchen  trimethoprim-polymyxin b (POLYTRIM) ophthalmic solution, Place 1 drop into the left eye every 4 (four) hours.    Past medical history, social, surgical and family history all reviewed in  electronic medical record.  No pertanent information unless stated regarding to the chief complaint.   Review of Systems:  No headache, visual changes, nausea, vomiting, diarrhea, constipation, dizziness, abdominal pain, skin rash, fevers, chills, night sweats, weight loss, swollen lymph nodes, body aches, joint swelling, muscle aches, chest pain, shortness of breath, mood changes.   Objective  Blood pressure 110/82, pulse 94, height 6' (1.829 m), weight 264 lb (119.7 kg), SpO2 98 %.    General: No apparent distress alert and oriented x3 mood and affect normal, dressed appropriately.  HEENT: Pupils equal, extraocular movements intact  Respiratory: Patient's speak in full sentences and does not appear short of breath  Cardiovascular: No lower extremity edema, non tender, no erythema  Skin: Warm dry intact with no signs of infection or rash on extremities or on axial skeleton.  Abdomen: Soft nontender  Neuro: Cranial nerves II through XII are intact, neurovascularly intact in all extremities with 2+ DTRs and 2+ pulses.  Lymph: No lymphadenopathy of posterior or anterior cervical chain or axillae bilaterally.  Gait normal with good balance and coordination.  MSK:  Non tender with full range of motion and good stability and symmetric strength and tone of  elbows, wrist, hip, knee and ankles bilaterally.  Shoulder: left Inspection reveals no abnormalities, atrophy or asymmetry. Palpation is normal with no tenderness over AC joint or bicipital groove. ROM is full in all planes passively. Rotator cuff strength normal throughout. signs of impingement with positive Neer and Hawkin's tests, but negative empty can sign. Speeds and Yergason's tests normal. No labral pathology noted with negative Obrien's, negative clunk and good stability. Normal scapular function observed. No painful arc and no drop arm sign. No apprehension sign  MSK US performed of: left This study was ordered, performed, and  interpreted by Mark Meyer D.O.  Shoulder:   Supraspinatus:  Appears normal on long and transverse views, Bursal bulge seen with shoulder abduction on impingement view. Infraspinatus: Tear noted approximately 1 cm in retraction noted.  Seems to be resolved with mild atrophy Subscapularis:  Appears normal on long and transverse views. Positive bursa AC joint:  Capsule undistended, no geyser sign. Glenohumeral Joint:  Appears normal without effusion. Glenoid Labrum: Unable to see Biceps Tendon:  Appears normal on long and transverse views, no fraying of tendon, tendon located in intertubercular groove, no subluxation with shoulder internal or external rotation.  Impression: Subacromial bursitis patient does have more of a infraspinatus tear as well  [id number]; 15 additional minutes spent for Therapeutic exercises as stated in above notes.  This included exercises focusing on stretching, strengthening, with significant focus on eccentric aspects.   Long term goals include an improvement in range of motion, strength, endurance as well as avoiding reinjury. Patient's frequency would include in 1-2 times a day, 3-5 times a  week for a duration of 6-12 weeks. Shoulder Exercises that included:  Basic scapular stabilization to include adduction and depression of scapula Scaption, focusing on proper movement and good control Internal and External rotation utilizing a theraband, with elbow tucked at side entire time Rows with theraband which was given  Proper technique shown and discussed handout in great detail with ATC.  All questions were discussed and answered.    Impression and Recommendations:     This case required medical decision making of moderate complexity. The above documentation has been reviewed and is accurate and complete Mark Saa, DO       Note: This dictation was prepared with Dragon dictation along with smaller phrase technology. Any transcriptional errors that result from  this process are unintentional.

## 2018-02-12 ENCOUNTER — Ambulatory Visit (INDEPENDENT_AMBULATORY_CARE_PROVIDER_SITE_OTHER): Payer: Self-pay | Admitting: Nurse Practitioner

## 2018-02-12 VITALS — BP 140/100 | HR 93 | Temp 98.2°F | Resp 18 | Wt 266.6 lb

## 2018-02-12 DIAGNOSIS — L509 Urticaria, unspecified: Secondary | ICD-10-CM

## 2018-02-12 MED ORDER — PREDNISONE 10 MG (21) PO TBPK: ORAL_TABLET | ORAL | 0 refills | Status: AC

## 2018-02-12 MED ORDER — FAMOTIDINE 10 MG PO TABS: 20.0000 mg | ORAL_TABLET | Freq: Every day | ORAL | 0 refills | Status: AC

## 2018-02-12 MED ORDER — CETIRIZINE HCL 10 MG PO TABS: 10.0000 mg | ORAL_TABLET | Freq: Every day | ORAL | 0 refills | Status: AC

## 2018-02-12 MED FILL — predniSONE 10 MG (21) TBPK: 10 | 6 days supply | Qty: 21 | Fill #0

## 2018-02-12 NOTE — Patient Instructions (Signed)
Hives -Take medication as prescribed.  Start prednisone on 02/13/2018 as directed in the a.m. with breakfast.  Also take Pepcid AC 20 mg and cetirizine 10 mg at this time.  Take these medications each morning until symptoms improve. -Take Benadryl 50 mg at bedtime until symptoms improve. -Use the Aveeno colloidal oatmeal bath to help with itching and discomfort. -Avoid hot baths or showers. -Be vigilant of what possible triggers may have caused this reaction. -Do not scratch or rub the skin. -Wear loose fitting clothing while symptoms persist. -Follow-up with your PCP if symptoms do not improve.  Hives (urticaria) are itchy, red, swollen areas on the skin. Hives can appear on any part of the body. Hives often fade within 24 hours (acute hives). Sometimes, new hives appear after old ones fade and the cycle can continue for several days or weeks (chronic hives). Hives do not spread from person to person (are not contagious). Hives come from the body's reaction to something a person is allergic to (allergen), something that causes irritation, or various other triggers. When a person is exposed to a trigger, his or her body releases a chemical (histamine) that causes redness, itching, and swelling. Hives can appear right after exposure to a trigger or hours later. What are the causes? This condition may be caused by:  Allergies to foods or ingredients.  Insect bites or stings.  Exposure to pollen or pets.  Contact with latex or chemicals.  Spending time in sunlight, heat, or cold (exposure).  Exercise.  Stress.  Certain medicines. You can also get hives from other medical conditions and treatments, such as:  Viruses, including the common cold.  Bacterial infections, such as urinary tract infections and strep throat.  Certain medicines.  Allergy shots.  Blood transfusions. Sometimes, the cause of this condition is not known (idiopathic hives). What increases the risk? You are  more likely to develop this condition if you:  Are a woman.  Have food allergies, especially to citrus fruits, milk, eggs, peanuts, tree nuts, or shellfish.  Are allergic to: ? Medicines. ? Latex. ? Insects. ? Animals. ? Pollen. What are the signs or symptoms? Common symptoms of this condition include raised, itchy, red or white bumps or patches on your skin. These areas may:  Become large and swollen (welts).  Change in shape and location, quickly and repeatedly.  Be separate hives or connect over a large area of skin.  Sting or become painful.  Turn white when pressed in the center (blanch). In severe cases, yourhands, feet, and face may also become swollen. This may occur if hives develop deeper in your skin. How is this diagnosed? This condition may be diagnosed by your symptoms, medical history, and physical exam.  Your skin, urine, or blood may be tested to find out what is causing your hives and to rule out other health issues.  Your health care provider may also remove a small sample of skin from the affected area and examine it under a microscope (biopsy). How is this treated? Treatment for this condition depends on the cause and severity of your symptoms. Your health care provider may recommend using cool, wet cloths (cool compresses) or taking cool showers to relieve itching. Treatment may include:  Medicines that help: ? Relieve itching (antihistamines). ? Reduce swelling (corticosteroids). ? Treat infection (antibiotics).  An injectable medicine (omalizumab). Your health care provider may prescribe this if you have chronic idiopathic hives and you continue to have symptoms even after treatment with antihistamines. Severe  cases may require an emergency injection of adrenaline (epinephrine) to prevent a life-threatening allergic reaction (anaphylaxis). Follow these instructions at home: Medicines  Take and apply over-the-counter and prescription medicines only  as told by your health care provider.  If you were prescribed an antibiotic medicine, take it as told by your health care provider. Do not stop using the antibiotic even if you start to feel better. Skin care  Apply cool compresses to the affected areas.  Do not scratch or rub your skin. General instructions  Do not take hot showers or baths. This can make itching worse.  Do not wear tight-fitting clothing.  Use sunscreen and wear protective clothing when you are outside.  Avoid any substances that cause your hives. Keep a journal to help track what causes your hives. Write down: ? What medicines you take. ? What you eat and drink. ? What products you use on your skin.  Keep all follow-up visits as told by your health care provider. This is important. Contact a health care provider if:  Your symptoms are not controlled with medicine.  Your joints are painful or swollen. Get help right away if:  You have a fever.  You have pain in your abdomen.  Your tongue or lips are swollen.  Your eyelids are swollen.  Your chest or throat feels tight.  You have trouble breathing or swallowing. These symptoms may represent a serious problem that is an emergency. Do not wait to see if the symptoms will go away. Get medical help right away. Call your local emergency services (911 in the [place] Do not drive yourself to the hospital. Summary  Hives (urticaria) are itchy, red, swollen areas on your skin. Hives come from the body's reaction to something a person is allergic to (allergen), something that causes irritation, or various other triggers.  Treatment for this condition depends on the cause and severity of your symptoms.  Avoid any substances that cause your hives. Keep a journal to help track what causes your hives.  Take and apply over-the-counter and prescription medicines only as told by your health care provider.  Keep all follow-up visits as told by your health care  provider. This is important. This information is not intended to replace advice given to you by your health care provider. Make sure you discuss any questions you have with your health care provider. Document Released: 01/01/2005 Document Revised: 07/17/2017 Document Reviewed: 07/17/2017 Elsevier Interactive Patient Education  2019 [employer].

## 2018-02-12 NOTE — Progress Notes (Signed)
Subjective:     Mark Meyer is a 38 y.o. male who presents for evaluation of a rash involving the generalized body surface area, greater than 80%. Rash started this morning. Lesions are erythematous, and raised in texture. Rash has changed over time. Rash is pruritic. Associated symptoms: none. Patient denies: abdominal pain, fever, headache, sore throat, vomiting and difficulty breathing or shortness of breath. Patient has had contacts with similar rash. Patient has not had new exposures (soaps, lotions, laundry detergents, foods, medications, plants, insects or animals).  Patient informs he took Benadryl earlier this morning around 11 AM which seemed to help his symptoms.   The following portions of the patient's history were reviewed and updated as appropriate: allergies, current medications and past medical history.  Review of Systems Constitutional: negative Eyes: negative Ears, nose, mouth, throat, and face: negative Respiratory: negative Cardiovascular: negative Gastrointestinal: negative Integument/breast: positive for pruritus, rash and skin color change, negative for skin lesion(s)    Objective:    BP (!) 140/100 (BP Location: Right Arm, Patient Position: Sitting, Cuff Size: Large)   Pulse 93   Temp 98.2 F (36.8 C) (Oral)   Resp 18   Wt 266 lb 9.6 oz (120.9 kg)   SpO2 96%   BMI 36.16 kg/m   Physical Exam Vitals signs reviewed.  Constitutional:      Appearance: He is normal weight.     Comments: Appears uncomfortable due to itching from rash  HENT:     Head: Normocephalic.     Right Ear: Tympanic membrane and ear canal normal.     Left Ear: Tympanic membrane and ear canal normal.     Nose: Nose normal.     Mouth/Throat:     Mouth: Mucous membranes are moist.  Eyes:     Pupils: Pupils are equal, round, and reactive to light.  Neck:     Musculoskeletal: Normal range of motion and neck supple. No neck rigidity or muscular tenderness.  Cardiovascular:     Rate  and Rhythm: Normal rate and regular rhythm.     Pulses: Normal pulses.     Heart sounds: Normal heart sounds.  Pulmonary:     Effort: Pulmonary effort is normal.     Breath sounds: Normal breath sounds.  Abdominal:     General: Abdomen is flat.     Tenderness: There is no abdominal tenderness.  Skin:    General: Skin is warm and dry.     Findings: Erythema and rash present.     Comments: Hive appearing rash to generalized body surface area.  Rash covers greater than 80% of the patient's body excluding his face and neck.  Patient has severe moderate to severe urticaria with flat-topped wheals and plaques that are erythematous with no unique pattern over the body surface area.  No drainage.  Neurological:     Mental Status: He is alert.     Assessment:    hives    Plan:   Exam findings, diagnosis etiology and medication use and indications reviewed with patient. Follow- Up and discharge instructions provided. No emergent/urgent issues found on exam.  Based on the patient's clinical presentation, symptoms, and physical assessment, patient's findings are consistent with that of hives.  Discussed treatment options with patient to include prednisone.  Patient did have some concerns about using prednisone as he stated this was used before and he felt "funny".  Informed patient that due to the widespread rash on his body, if he could tolerate the prednisone,  I would like to prescribe this for him.  Patient was in agreement with attempting to try the prednisone again.  Also prescribed patient Zyrtec and Pepcid AC.  Patient will also continue Benadryl 50 mg at bedtime until his symptoms improve.  There is no concern for contagion and  there is no discharge.  Discussed with patient potential triggers, but he could not pinpoint what could have caused this rash.  Also discussed with patient his elevated blood pressure.  Based on the patient's clinical presentation, blood pressure is most likely associated  with his current disposition and there is no concern for hypertensive issues.  Patient will follow-up with his PCP for his blood pressure issues.  Patient education was provided. Patient verbalized understanding of information provided and agrees with plan of care (POC), all questions answered. The patient is advised to call or return to clinic if condition does not see an improvement in symptoms, or to seek the care of the closest emergency department if condition worsens with the above plan.   1. Hives  - predniSONE (STERAPRED UNI-PAK 21 TAB) 10 MG (21) TBPK tablet; Take as directed.  Dispense: 21 tablet; Refill: 0 - famotidine (PEPCID AC) 10 MG tablet; Take 2 tablets (20 mg total) by mouth daily for 15 days.  Dispense: 30 tablet; Refill: 0 - cetirizine (ZYRTEC) 10 MG tablet; Take 1 tablet (10 mg total) by mouth daily for 30 days.  Dispense: 30 tablet; Refill: 0 -Take medication as prescribed.  Start prednisone on 02/13/2018 as directed in the a.m. with breakfast.  Also take Pepcid AC 20 mg and cetirizine 10 mg at this time.  Take these medications each morning until symptoms improve. -Take Benadryl 50 mg at bedtime until symptoms improve. -Use the Aveeno colloidal oatmeal bath to help with itching and discomfort. -Avoid hot baths or showers. -Be vigilant of what possible triggers may have caused this reaction. -Do not scratch or rub the skin. -Wear loose fitting clothing while symptoms persist. -Follow-up with your PCP if symptoms do not improve.

## 2018-03-18 ENCOUNTER — Telehealth: Payer: 59 | Admitting: Physician Assistant

## 2018-03-18 DIAGNOSIS — R208 Other disturbances of skin sensation: Secondary | ICD-10-CM | POA: Diagnosis not present

## 2018-03-18 DIAGNOSIS — R21 Rash and other nonspecific skin eruption: Secondary | ICD-10-CM

## 2018-03-18 MED ORDER — CEPHALEXIN 500 MG PO CAPS: 500.0000 mg | ORAL_CAPSULE | Freq: Four times a day (QID) | ORAL | 0 refills | Status: AC

## 2018-03-18 MED FILL — CEPHALEXIN 500 MG CAPSULE: 500 | 5 days supply | Qty: 20 | Fill #0

## 2018-03-18 NOTE — Progress Notes (Signed)
E Visit for Cellulitis  We are sorry that you are not feeling well. Here is how we plan to help!  Based on what you shared with me it looks like you have cellulitis.  Cellulitis looks like areas of skin redness, swelling, and warmth; it develops as a result of bacteria entering under the skin. Little red spots and/or bleeding can be seen in skin, and tiny surface sacs containing fluid can occur. Fever can be present. Cellulitis is almost always on one side of a body, and the lower limbs are the most common site of involvement.   I have prescribed:  Keflex 500mg  orally, 4 times a day for 5 days  HOME CARE:  Take your medications as ordered and take all of them, even if the skin irritation appears to be healing.   GET HELP RIGHT AWAY IF:  Symptoms that don't begin to go away within 48 hours. Severe redness persists or worsens If the area turns color, spreads or swells. If it blisters and opens, develops yellow-brown crust or bleeds. You develop a fever or chills. If the pain increases or becomes unbearable.  Are unable to keep fluids and food down.  MAKE SURE YOU   Understand these instructions. Will watch your condition. Will get help right away if you are not doing well or get worse.  Thank you for choosing an e-visit. Your e-visit answers were reviewed by a board certified advanced clinical practitioner to complete your personal care plan. Depending upon the condition, your plan could have included both over the counter or prescription medications. Please review your pharmacy choice. Make sure the pharmacy is open so you can pick up prescription now. If there is a problem, you may contact your provider through [employer] and have the prescription routed to another pharmacy. Your safety is important to [place]. If you have drug allergies check your prescription carefully.  For the next 24 hours you can use MyChart to ask questions about today's visit, request a non-urgent call back,  or ask for a work or school excuse. You will get an email in the next two days asking about your experience. I hope that your e-visit has been valuable and will speed your recovery.  ===View-only below this line===   ----- Message -----    From: Marlis Edelson    Sent: 03/18/2018 12:30 PM EST      To: E-Visit Mailing List Subject: E-Visit Submission: Cellulitis  E-Visit Submission: Cellulitis --------------------------------  Question: Please describe how your skin condition appears.  You may choose more than one. Answer:   Swelling (raised region)            Hot to touch            Redness  Question: What areas are affected? Answer:   Arms/hands or elbow  Question: Did you previously have; Answer:   Insect bite  Question: Do you have a history of; (choose all that apply) Answer:   None of the above  Question: Do you have a fever? Answer:   No, I do not have a fever  Question: Do you have chills? Answer:   No  Question: Do you have swollen glands? Answer:   No  Question: Does the affected area itch? Answer:   No  Question: Is the area painful? Answer:   Yes  Question: Please rate your pain on a scale of 1-10.  1 being the least and 10 being the worst. Answer:   1 (least)  Question: How long have you been having these symptoms? Answer:   2 days  Question: Have you had a tick bite in the last 2 weeks? Answer:   No  Question: Have you tried any over-the-counter medications? Answer:   No, I have not tried any medications  Question: Please list your medication allergies that you may have ? (If 'none' , please list as 'none') Answer:   sulfa drugs  Question: Please list any additional comments  Answer:   I woke up yesterday with a raised bump on my forearm.  It was sore to the touch.  The redness around the area has gradually spread over night and throughout the day today.  I let a (PA) in our department take a look at it.  They said it looked like maybe a spider  bite that has caused infection with erythema.  They recommended I do a e visit because it looked like I could use some antibiotics.

## 2018-05-21 MED FILL — METHYLPREDNISOLONE 4 MG TBP: 4 | 6 days supply | Qty: 21 | Fill #0

## 2018-06-18 DIAGNOSIS — Z83511 Family history of glaucoma: Secondary | ICD-10-CM | POA: Diagnosis not present

## 2018-06-18 DIAGNOSIS — H40053 Ocular hypertension, bilateral: Secondary | ICD-10-CM | POA: Diagnosis not present

## 2018-06-18 MED FILL — LATANOPROST 0.005% EYE DRP: 0.005 | 75 days supply | Qty: 8 | Fill #0

## 2018-06-18 MED FILL — LISINOPRIL 10 MG TABLET: 10 | 90 days supply | Qty: 90 | Fill #3

## 2018-07-16 DIAGNOSIS — H40053 Ocular hypertension, bilateral: Secondary | ICD-10-CM | POA: Diagnosis not present

## 2018-10-29 MED FILL — LATANOPROST 0.005% OPTH SOL: 0.005 | 75 days supply | Qty: 8 | Fill #0

## 2018-11-20 DIAGNOSIS — H40053 Ocular hypertension, bilateral: Secondary | ICD-10-CM | POA: Diagnosis not present

## 2019-04-12 IMAGING — CT CT RENAL STONE PROTOCOL
2 of 4 series · 15 of 46 positions shown, 17 images · non-contrast
Comparison: Abdominal ultrasound 03/17/2014.

CLINICAL DATA: Left flank pain with nausea and microhematuria.
History of cholecystectomy.

EXAM:
CT ABDOMEN AND PELVIS WITHOUT CONTRAST
TECHNIQUE: Multidetector CT imaging of the abdomen and pelvis was performed
following the standard protocol without IV contrast.

[Series 2: axial st · axial · 0.76mm/px · z∈[+1092,+1547]mm · 12 of 103 slices shown, 14 images]
[im 6/103  soft-tissue]
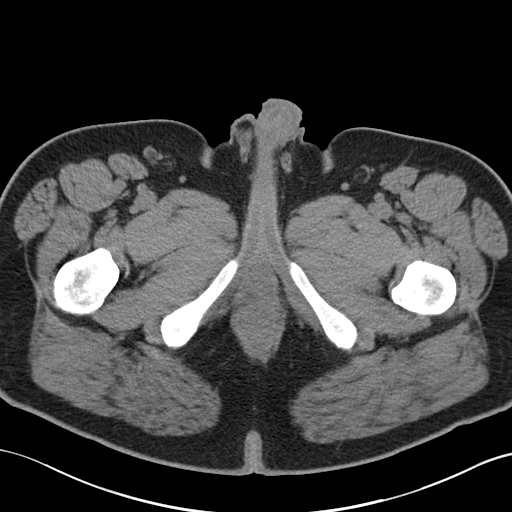
[im 6/103  bone]
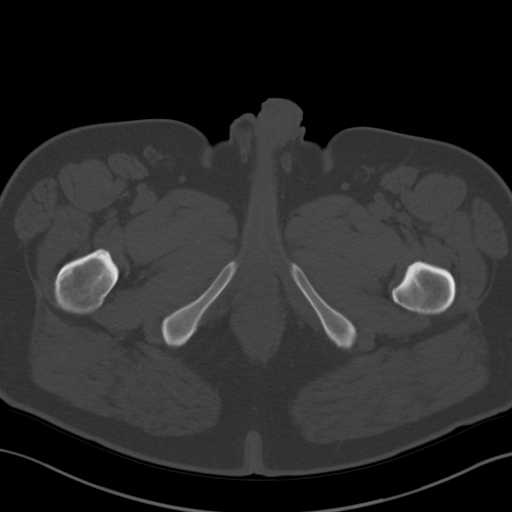
[im 16/103  soft-tissue]
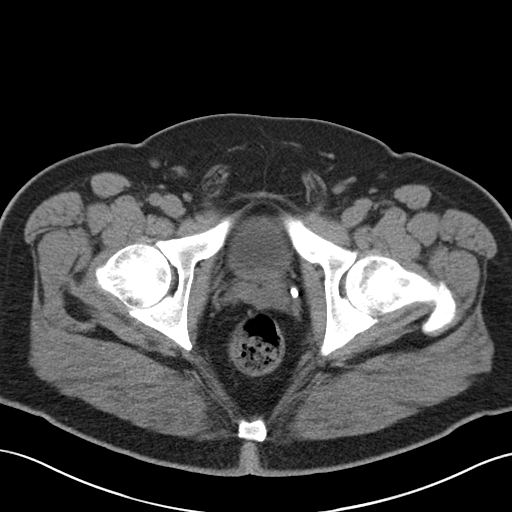
[im 21/103  soft-tissue]
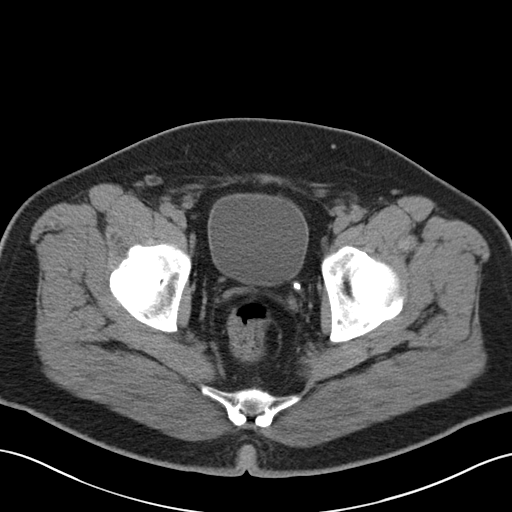
[im 31/103  soft-tissue]
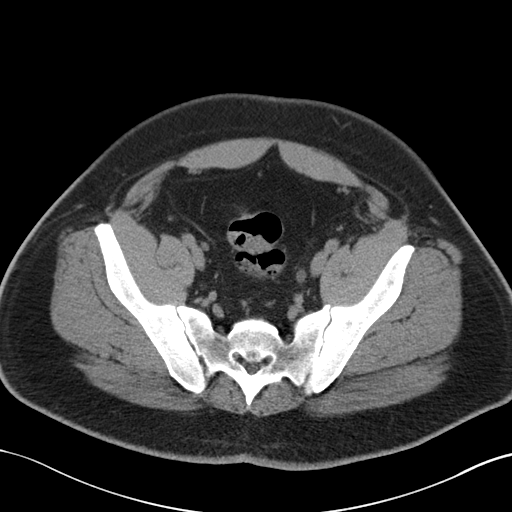
[im 41/103  soft-tissue]
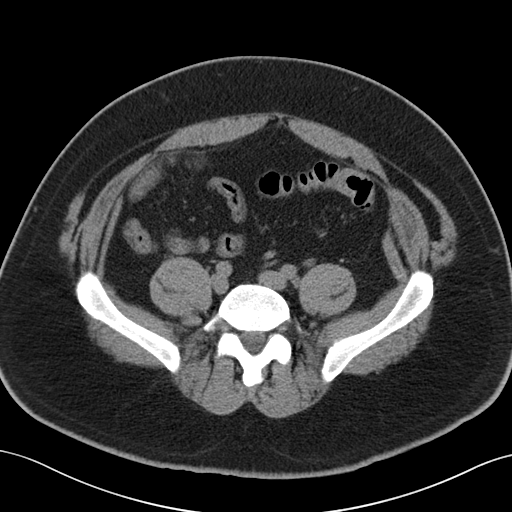
[im 46/103  soft-tissue]
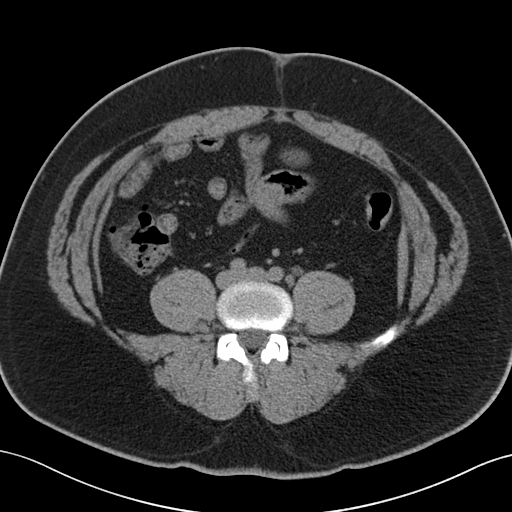
[im 57/103  soft-tissue]
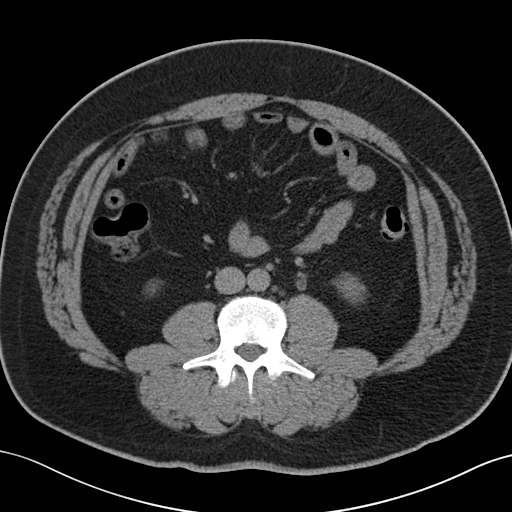
[im 62/103  soft-tissue]
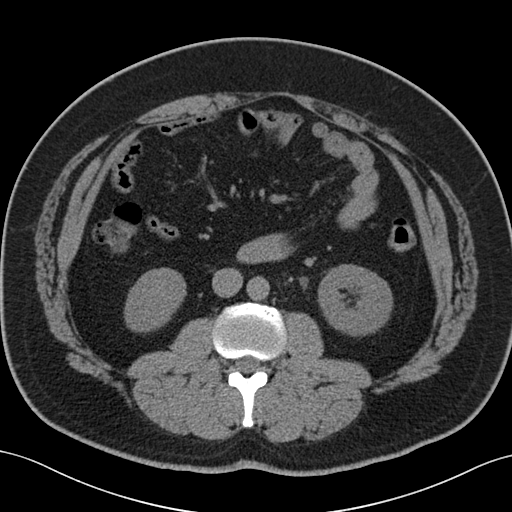
[im 72/103  soft-tissue]
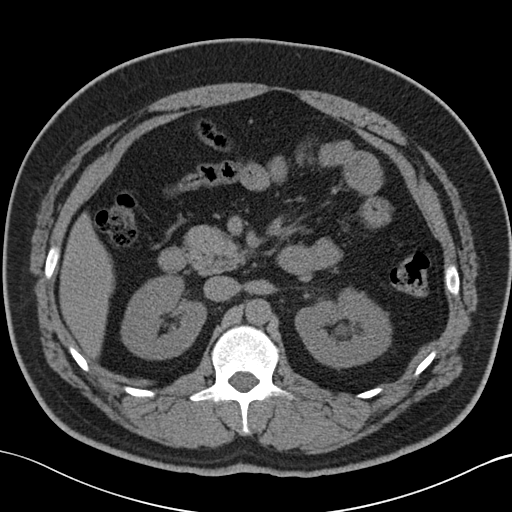
[im 72/103  bone]
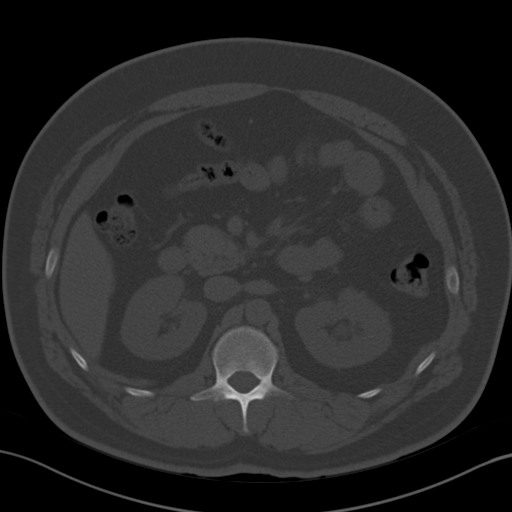
[im 82/103  soft-tissue]
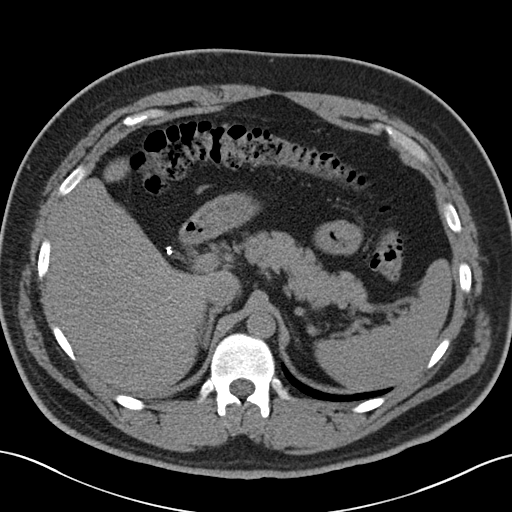
[im 87/103  soft-tissue]
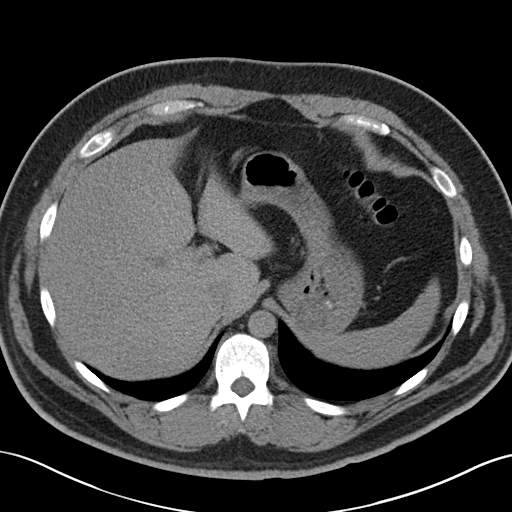
[im 97/103  soft-tissue]
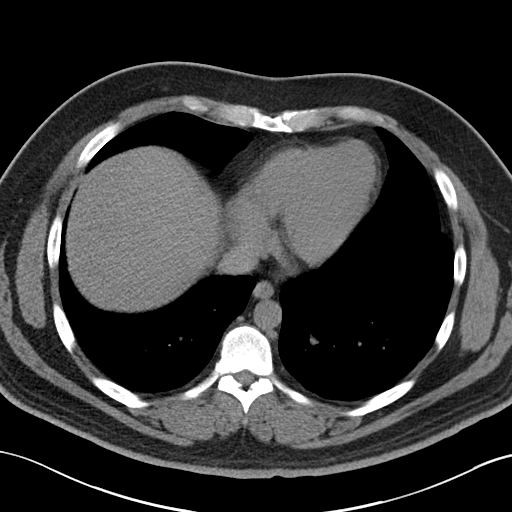

[Series 4: coronal · coronal · 0.73mm/px · 3 of 151 slices shown]
[im 51/151  soft-tissue]
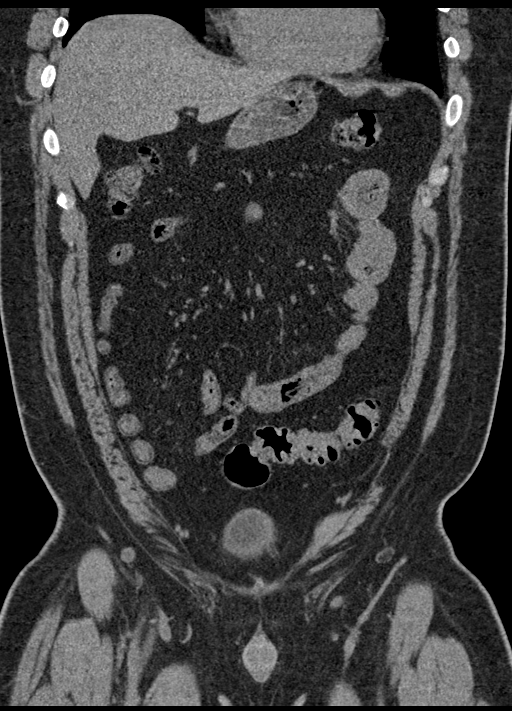
[im 67/151  soft-tissue]
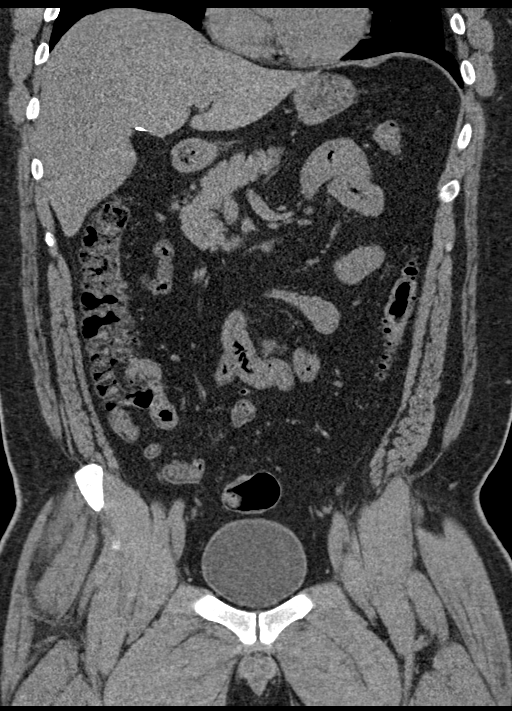
[im 84/151  soft-tissue]
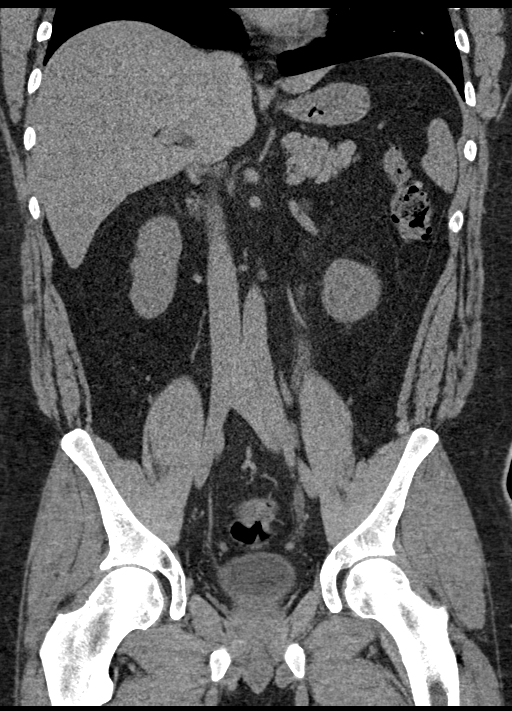

[15 of 46 positions shown; findings below may reference images not displayed]

FINDINGS: Lower chest: Clear lung bases. No significant pleural or pericardial
effusion.

Hepatobiliary: Decreased hepatic density consistent with steatosis.
No focal lesions identified on noncontrast imaging. There is no
significant biliary dilatation status post interval cholecystectomy.

Pancreas: Unremarkable. No pancreatic ductal dilatation or
surrounding inflammatory changes.

Spleen: Normal in size without focal abnormality.

Adrenals/Urinary Tract: Both adrenal glands appear normal. There is
mild to moderate left-sided hydronephrosis and hydroureter secondary
to an obstructing 6 mm calculus at the left ureterovesical junction.
This is visible on the scout image. There is mild perinephric soft
tissue stranding on the left. The kidneys otherwise appear normal
without additional urinary tract calculi. The bladder appears
normal.

Stomach/Bowel: No evidence of bowel wall thickening, distention or
surrounding inflammatory change. Mild distal colonic diverticulosis.
The appendix appears normal.

Vascular/Lymphatic: There are no enlarged abdominal or pelvic lymph
nodes. No significant vascular findings on noncontrast imaging.

Reproductive: The prostate gland and seminal vesicles appear normal.

Other: No evidence of abdominal wall mass or hernia. No ascites.

Musculoskeletal: No acute or significant osseous findings.
IMPRESSION: 1. Obstructing 6 mm calculus at the left ureterovesical junction
with associated mild to moderate hydronephrosis and hydroureter.
2. No other urinary tract calculi.
3. Hepatic steatosis.

## 2019-05-18 DIAGNOSIS — H5202 Hypermetropia, left eye: Secondary | ICD-10-CM | POA: Diagnosis not present

## 2019-05-18 DIAGNOSIS — H52223 Regular astigmatism, bilateral: Secondary | ICD-10-CM | POA: Diagnosis not present

## 2019-05-18 DIAGNOSIS — H40053 Ocular hypertension, bilateral: Secondary | ICD-10-CM | POA: Diagnosis not present

## 2019-05-18 DIAGNOSIS — H40043 Steroid responder, bilateral: Secondary | ICD-10-CM | POA: Diagnosis not present

## 2019-06-10 MED FILL — LATANOPROST 0.005% OPTH SOL: 0.005 | 75 days supply | Qty: 8 | Fill #1

## 2019-07-08 DIAGNOSIS — Z0000 Encounter for general adult medical examination without abnormal findings: Secondary | ICD-10-CM | POA: Diagnosis not present

## 2019-07-08 DIAGNOSIS — Z87442 Personal history of urinary calculi: Secondary | ICD-10-CM | POA: Diagnosis not present

## 2019-07-08 DIAGNOSIS — H409 Unspecified glaucoma: Secondary | ICD-10-CM | POA: Diagnosis not present

## 2019-07-08 DIAGNOSIS — R5381 Other malaise: Secondary | ICD-10-CM | POA: Diagnosis not present

## 2019-07-08 DIAGNOSIS — R5383 Other fatigue: Secondary | ICD-10-CM | POA: Diagnosis not present

## 2019-07-08 DIAGNOSIS — I10 Essential (primary) hypertension: Secondary | ICD-10-CM | POA: Diagnosis not present

## 2019-07-13 ENCOUNTER — Other Ambulatory Visit (HOSPITAL_COMMUNITY): Payer: Self-pay | Admitting: Specialist

## 2019-07-15 MED FILL — LISINOPRIL 10 MG TABS: 10 | 90 days supply | Qty: 90 | Fill #0

## 2019-10-09 MED FILL — LISINOPRIL 10 MG TABS: 10 | 90 days supply | Qty: 90 | Fill #1

## 2019-10-09 MED FILL — LATANOPROST 0.005% OPTH SOL: 0.005 | 75 days supply | Qty: 8 | Fill #2

## 2019-11-18 DIAGNOSIS — H40053 Ocular hypertension, bilateral: Secondary | ICD-10-CM | POA: Diagnosis not present

## 2020-01-13 MED FILL — LISINOPRIL 10 MG TABS: 10 | 90 days supply | Qty: 90 | Fill #2

## 2020-02-12 ENCOUNTER — Other Ambulatory Visit: Payer: Self-pay | Admitting: Radiation Oncology

## 2020-02-12 DIAGNOSIS — U071 COVID-19: Secondary | ICD-10-CM

## 2020-02-12 NOTE — Progress Notes (Signed)
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  He states he has had 2 of the mRNA vaccines thus far, but has not received the booster.  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  He has hypertension himself.  He has been symptomatic with cough and congestion, and has been started by his PCP with oral steroids today.  His symptoms began on Tuesday, 02/09/2020.  He is given me permission to make request for monoclonal antibody infusion in the referral that I displaced.  I will copy this to his PCP as he has been unsuccessful in reaching him today as well.     Osker Mason, PAC

## 2020-04-20 ENCOUNTER — Other Ambulatory Visit (HOSPITAL_COMMUNITY): Payer: Self-pay

## 2020-04-20 MED FILL — Lisinopril Tab 10 MG: ORAL | 90 days supply | Qty: 90 | Fill #0 | Status: AC

## 2020-06-07 ENCOUNTER — Other Ambulatory Visit (HOSPITAL_COMMUNITY): Payer: Self-pay

## 2020-06-07 MED ORDER — LATANOPROST 0.005 % OP SOLN
OPHTHALMIC | 0 refills | Status: DC
  Filled 2020-06-07: qty 7.5, 75d supply, fill #0

## 2020-07-04 DIAGNOSIS — H409 Unspecified glaucoma: Secondary | ICD-10-CM | POA: Diagnosis not present

## 2020-07-04 DIAGNOSIS — R5383 Other fatigue: Secondary | ICD-10-CM | POA: Diagnosis not present

## 2020-07-04 DIAGNOSIS — Z801 Family history of malignant neoplasm of trachea, bronchus and lung: Secondary | ICD-10-CM | POA: Diagnosis not present

## 2020-07-04 DIAGNOSIS — Z8616 Personal history of COVID-19: Secondary | ICD-10-CM | POA: Diagnosis not present

## 2020-07-04 DIAGNOSIS — I10 Essential (primary) hypertension: Secondary | ICD-10-CM | POA: Diagnosis not present

## 2020-07-04 DIAGNOSIS — R5381 Other malaise: Secondary | ICD-10-CM | POA: Diagnosis not present

## 2020-07-04 DIAGNOSIS — Z87442 Personal history of urinary calculi: Secondary | ICD-10-CM | POA: Diagnosis not present

## 2020-07-04 DIAGNOSIS — Z125 Encounter for screening for malignant neoplasm of prostate: Secondary | ICD-10-CM | POA: Diagnosis not present

## 2020-07-04 DIAGNOSIS — Z0000 Encounter for general adult medical examination without abnormal findings: Secondary | ICD-10-CM | POA: Diagnosis not present

## 2020-07-04 DIAGNOSIS — Z0001 Encounter for general adult medical examination with abnormal findings: Secondary | ICD-10-CM | POA: Diagnosis not present

## 2020-07-06 ENCOUNTER — Other Ambulatory Visit (HOSPITAL_COMMUNITY): Payer: Self-pay

## 2020-07-06 MED ORDER — LISINOPRIL 10 MG PO TABS
1.0000 | ORAL_TABLET | Freq: Every day | ORAL | 3 refills | Status: DC
  Filled 2020-07-06: qty 90, 90d supply, fill #0
  Filled 2020-10-24: qty 90, 90d supply, fill #1
  Filled 2021-01-17: qty 90, 90d supply, fill #2
  Filled 2021-04-11: qty 90, 90d supply, fill #3

## 2020-08-03 DIAGNOSIS — H40053 Ocular hypertension, bilateral: Secondary | ICD-10-CM | POA: Diagnosis not present

## 2020-08-03 DIAGNOSIS — H5203 Hypermetropia, bilateral: Secondary | ICD-10-CM | POA: Diagnosis not present

## 2020-08-03 DIAGNOSIS — H40043 Steroid responder, bilateral: Secondary | ICD-10-CM | POA: Diagnosis not present

## 2020-10-24 ENCOUNTER — Other Ambulatory Visit (HOSPITAL_COMMUNITY): Payer: Self-pay

## 2020-12-29 ENCOUNTER — Other Ambulatory Visit (HOSPITAL_COMMUNITY): Payer: Self-pay

## 2020-12-30 ENCOUNTER — Other Ambulatory Visit (HOSPITAL_COMMUNITY): Payer: Self-pay

## 2020-12-30 MED ORDER — LATANOPROST 0.005 % OP SOLN
OPHTHALMIC | 3 refills | Status: DC
  Filled 2020-12-30: qty 7.5, 90d supply, fill #0
  Filled 2021-06-07: qty 7.5, 90d supply, fill #1
  Filled 2021-09-27: qty 7.5, 75d supply, fill #2

## 2021-01-02 ENCOUNTER — Other Ambulatory Visit (HOSPITAL_COMMUNITY): Payer: Self-pay

## 2021-01-17 ENCOUNTER — Other Ambulatory Visit (HOSPITAL_COMMUNITY): Payer: Self-pay

## 2021-02-06 DIAGNOSIS — H40043 Steroid responder, bilateral: Secondary | ICD-10-CM | POA: Diagnosis not present

## 2021-02-06 DIAGNOSIS — Z83511 Family history of glaucoma: Secondary | ICD-10-CM | POA: Diagnosis not present

## 2021-02-06 DIAGNOSIS — H40053 Ocular hypertension, bilateral: Secondary | ICD-10-CM | POA: Diagnosis not present

## 2021-04-11 ENCOUNTER — Other Ambulatory Visit (HOSPITAL_COMMUNITY): Payer: Self-pay

## 2021-06-07 ENCOUNTER — Other Ambulatory Visit (HOSPITAL_COMMUNITY): Payer: Self-pay

## 2021-06-14 DIAGNOSIS — H409 Unspecified glaucoma: Secondary | ICD-10-CM | POA: Diagnosis not present

## 2021-06-14 DIAGNOSIS — R5383 Other fatigue: Secondary | ICD-10-CM | POA: Diagnosis not present

## 2021-06-14 DIAGNOSIS — Z87442 Personal history of urinary calculi: Secondary | ICD-10-CM | POA: Diagnosis not present

## 2021-06-14 DIAGNOSIS — Z0000 Encounter for general adult medical examination without abnormal findings: Secondary | ICD-10-CM | POA: Diagnosis not present

## 2021-06-14 DIAGNOSIS — Z125 Encounter for screening for malignant neoplasm of prostate: Secondary | ICD-10-CM | POA: Diagnosis not present

## 2021-06-14 DIAGNOSIS — R5381 Other malaise: Secondary | ICD-10-CM | POA: Diagnosis not present

## 2021-06-14 DIAGNOSIS — E786 Lipoprotein deficiency: Secondary | ICD-10-CM | POA: Diagnosis not present

## 2021-06-14 DIAGNOSIS — Z0001 Encounter for general adult medical examination with abnormal findings: Secondary | ICD-10-CM | POA: Diagnosis not present

## 2021-06-14 DIAGNOSIS — I10 Essential (primary) hypertension: Secondary | ICD-10-CM | POA: Diagnosis not present

## 2021-06-14 DIAGNOSIS — Z8616 Personal history of COVID-19: Secondary | ICD-10-CM | POA: Diagnosis not present

## 2021-07-10 ENCOUNTER — Other Ambulatory Visit (HOSPITAL_COMMUNITY): Payer: Self-pay

## 2021-07-10 MED ORDER — LISINOPRIL 10 MG PO TABS
10.0000 mg | ORAL_TABLET | Freq: Every day | ORAL | 3 refills | Status: DC
  Filled 2021-07-10: qty 90, 90d supply, fill #0
  Filled 2021-10-23: qty 90, 90d supply, fill #1
  Filled 2022-01-22: qty 90, 90d supply, fill #2
  Filled 2022-04-17: qty 90, 90d supply, fill #3

## 2021-08-07 DIAGNOSIS — H40053 Ocular hypertension, bilateral: Secondary | ICD-10-CM | POA: Diagnosis not present

## 2021-08-07 DIAGNOSIS — H52223 Regular astigmatism, bilateral: Secondary | ICD-10-CM | POA: Diagnosis not present

## 2021-08-07 DIAGNOSIS — H40043 Steroid responder, bilateral: Secondary | ICD-10-CM | POA: Diagnosis not present

## 2021-09-27 ENCOUNTER — Other Ambulatory Visit (HOSPITAL_COMMUNITY): Payer: Self-pay

## 2021-10-02 DIAGNOSIS — Z20822 Contact with and (suspected) exposure to covid-19: Secondary | ICD-10-CM | POA: Diagnosis not present

## 2021-10-02 DIAGNOSIS — I10 Essential (primary) hypertension: Secondary | ICD-10-CM | POA: Diagnosis not present

## 2021-10-02 DIAGNOSIS — R509 Fever, unspecified: Secondary | ICD-10-CM | POA: Diagnosis not present

## 2021-10-02 DIAGNOSIS — U071 COVID-19: Secondary | ICD-10-CM | POA: Diagnosis not present

## 2021-10-02 DIAGNOSIS — R051 Acute cough: Secondary | ICD-10-CM | POA: Diagnosis not present

## 2021-10-02 DIAGNOSIS — R079 Chest pain, unspecified: Secondary | ICD-10-CM | POA: Diagnosis not present

## 2021-10-02 DIAGNOSIS — R059 Cough, unspecified: Secondary | ICD-10-CM | POA: Diagnosis not present

## 2021-10-05 DIAGNOSIS — F419 Anxiety disorder, unspecified: Secondary | ICD-10-CM | POA: Diagnosis not present

## 2021-10-05 DIAGNOSIS — R0789 Other chest pain: Secondary | ICD-10-CM | POA: Diagnosis not present

## 2021-10-05 DIAGNOSIS — U071 COVID-19: Secondary | ICD-10-CM | POA: Diagnosis not present

## 2021-10-05 DIAGNOSIS — J40 Bronchitis, not specified as acute or chronic: Secondary | ICD-10-CM | POA: Diagnosis not present

## 2021-10-11 DIAGNOSIS — R079 Chest pain, unspecified: Secondary | ICD-10-CM | POA: Diagnosis not present

## 2021-10-11 DIAGNOSIS — R0789 Other chest pain: Secondary | ICD-10-CM | POA: Diagnosis not present

## 2021-10-23 ENCOUNTER — Other Ambulatory Visit (HOSPITAL_COMMUNITY): Payer: Self-pay

## 2022-02-07 ENCOUNTER — Other Ambulatory Visit (HOSPITAL_COMMUNITY): Payer: Self-pay

## 2022-02-07 DIAGNOSIS — H40043 Steroid responder, bilateral: Secondary | ICD-10-CM | POA: Diagnosis not present

## 2022-02-07 DIAGNOSIS — Z83511 Family history of glaucoma: Secondary | ICD-10-CM | POA: Diagnosis not present

## 2022-02-07 DIAGNOSIS — H40053 Ocular hypertension, bilateral: Secondary | ICD-10-CM | POA: Diagnosis not present

## 2022-02-07 MED ORDER — LATANOPROST 0.005 % OP SOLN
1.0000 [drp] | Freq: Every evening | OPHTHALMIC | 4 refills | Status: DC
  Filled 2022-02-07: qty 7.5, 75d supply, fill #0
  Filled 2022-06-25: qty 7.5, 75d supply, fill #1
  Filled 2022-09-27: qty 7.5, 75d supply, fill #2
  Filled 2023-01-31: qty 7.5, 75d supply, fill #3

## 2022-04-17 ENCOUNTER — Other Ambulatory Visit (HOSPITAL_COMMUNITY): Payer: Self-pay

## 2022-04-17 ENCOUNTER — Other Ambulatory Visit: Payer: Self-pay

## 2022-06-25 ENCOUNTER — Other Ambulatory Visit (HOSPITAL_COMMUNITY): Payer: Self-pay

## 2022-07-06 ENCOUNTER — Other Ambulatory Visit (HOSPITAL_COMMUNITY): Payer: Self-pay

## 2022-07-06 DIAGNOSIS — H409 Unspecified glaucoma: Secondary | ICD-10-CM | POA: Diagnosis not present

## 2022-07-06 DIAGNOSIS — I10 Essential (primary) hypertension: Secondary | ICD-10-CM | POA: Diagnosis not present

## 2022-07-06 DIAGNOSIS — Z0000 Encounter for general adult medical examination without abnormal findings: Secondary | ICD-10-CM | POA: Diagnosis not present

## 2022-07-06 DIAGNOSIS — Z87442 Personal history of urinary calculi: Secondary | ICD-10-CM | POA: Diagnosis not present

## 2022-07-06 DIAGNOSIS — R5383 Other fatigue: Secondary | ICD-10-CM | POA: Diagnosis not present

## 2022-07-06 DIAGNOSIS — R5381 Other malaise: Secondary | ICD-10-CM | POA: Diagnosis not present

## 2022-07-06 MED ORDER — LISINOPRIL 10 MG PO TABS
10.0000 mg | ORAL_TABLET | Freq: Every day | ORAL | 3 refills | Status: DC
  Filled 2022-07-06: qty 90, 90d supply, fill #0
  Filled 2022-11-06: qty 90, 90d supply, fill #1
  Filled 2023-01-31: qty 90, 90d supply, fill #2
  Filled 2023-05-07: qty 90, 90d supply, fill #3

## 2022-08-09 DIAGNOSIS — H52223 Regular astigmatism, bilateral: Secondary | ICD-10-CM | POA: Diagnosis not present

## 2022-08-09 DIAGNOSIS — H40053 Ocular hypertension, bilateral: Secondary | ICD-10-CM | POA: Diagnosis not present

## 2022-08-09 DIAGNOSIS — H5211 Myopia, right eye: Secondary | ICD-10-CM | POA: Diagnosis not present

## 2022-09-27 ENCOUNTER — Other Ambulatory Visit (HOSPITAL_COMMUNITY): Payer: Self-pay

## 2022-10-01 ENCOUNTER — Other Ambulatory Visit (HOSPITAL_COMMUNITY): Payer: Self-pay

## 2022-11-08 ENCOUNTER — Other Ambulatory Visit (HOSPITAL_COMMUNITY): Payer: Self-pay

## 2023-03-25 ENCOUNTER — Other Ambulatory Visit (HOSPITAL_COMMUNITY): Payer: Self-pay

## 2023-03-25 MED ORDER — LATANOPROST 0.005 % OP SOLN
1.0000 [drp] | Freq: Every day | OPHTHALMIC | 3 refills | Status: AC
  Filled 2023-03-25: qty 7.5, 150d supply, fill #0
  Filled 2023-04-01 – 2023-05-07 (×2): qty 7.5, 75d supply, fill #0
  Filled 2023-08-06: qty 7.5, 75d supply, fill #1
  Filled 2023-10-17 – 2023-11-05 (×2): qty 7.5, 75d supply, fill #2
  Filled 2024-01-15: qty 7.5, 75d supply, fill #3

## 2023-04-01 ENCOUNTER — Other Ambulatory Visit (HOSPITAL_COMMUNITY): Payer: Self-pay

## 2023-04-11 ENCOUNTER — Other Ambulatory Visit (HOSPITAL_COMMUNITY): Payer: Self-pay

## 2023-05-07 ENCOUNTER — Other Ambulatory Visit: Payer: Self-pay

## 2023-05-07 ENCOUNTER — Other Ambulatory Visit (HOSPITAL_COMMUNITY): Payer: Self-pay

## 2023-07-08 ENCOUNTER — Other Ambulatory Visit (HOSPITAL_COMMUNITY): Payer: Self-pay

## 2023-07-08 DIAGNOSIS — H409 Unspecified glaucoma: Secondary | ICD-10-CM | POA: Diagnosis not present

## 2023-07-08 DIAGNOSIS — I10 Essential (primary) hypertension: Secondary | ICD-10-CM | POA: Diagnosis not present

## 2023-07-08 DIAGNOSIS — R5383 Other fatigue: Secondary | ICD-10-CM | POA: Diagnosis not present

## 2023-07-08 DIAGNOSIS — Z87442 Personal history of urinary calculi: Secondary | ICD-10-CM | POA: Diagnosis not present

## 2023-07-08 DIAGNOSIS — R5381 Other malaise: Secondary | ICD-10-CM | POA: Diagnosis not present

## 2023-07-08 DIAGNOSIS — Z0000 Encounter for general adult medical examination without abnormal findings: Secondary | ICD-10-CM | POA: Diagnosis not present

## 2023-07-08 MED ORDER — LISINOPRIL 10 MG PO TABS
10.0000 mg | ORAL_TABLET | Freq: Every day | ORAL | 3 refills | Status: AC
  Filled 2023-08-06: qty 90, 90d supply, fill #0
  Filled 2023-11-05: qty 90, 90d supply, fill #1
  Filled 2024-01-30: qty 90, 90d supply, fill #2

## 2023-07-29 DIAGNOSIS — D1801 Hemangioma of skin and subcutaneous tissue: Secondary | ICD-10-CM | POA: Diagnosis not present

## 2023-07-29 DIAGNOSIS — D2239 Melanocytic nevi of other parts of face: Secondary | ICD-10-CM | POA: Diagnosis not present

## 2023-08-06 ENCOUNTER — Other Ambulatory Visit (HOSPITAL_COMMUNITY): Payer: Self-pay

## 2023-08-06 ENCOUNTER — Other Ambulatory Visit: Payer: Self-pay

## 2023-09-03 ENCOUNTER — Ambulatory Visit: Admitting: Physician Assistant

## 2023-10-04 ENCOUNTER — Other Ambulatory Visit: Payer: Self-pay | Admitting: Medical Genetics

## 2023-10-17 ENCOUNTER — Other Ambulatory Visit (HOSPITAL_COMMUNITY): Payer: Self-pay

## 2023-10-17 ENCOUNTER — Encounter (HOSPITAL_COMMUNITY): Payer: Self-pay | Admitting: Pharmacist

## 2023-10-21 ENCOUNTER — Other Ambulatory Visit: Payer: Self-pay

## 2023-10-24 ENCOUNTER — Other Ambulatory Visit: Payer: Self-pay

## 2023-11-05 ENCOUNTER — Other Ambulatory Visit (HOSPITAL_COMMUNITY): Payer: Self-pay

## 2023-11-08 ENCOUNTER — Other Ambulatory Visit (HOSPITAL_COMMUNITY): Payer: Self-pay

## 2023-11-12 ENCOUNTER — Other Ambulatory Visit (HOSPITAL_COMMUNITY): Payer: Self-pay

## 2023-11-12 DIAGNOSIS — H52223 Regular astigmatism, bilateral: Secondary | ICD-10-CM | POA: Diagnosis not present

## 2023-11-12 MED ORDER — LATANOPROST 0.005 % OP SOLN
1.0000 [drp] | Freq: Every evening | OPHTHALMIC | 4 refills | Status: AC
  Filled 2023-11-13 – 2023-11-15 (×3): qty 7.5, 75d supply, fill #0
  Filled 2023-11-16 – 2023-11-17 (×2): qty 7.5, 150d supply, fill #0

## 2023-11-13 ENCOUNTER — Other Ambulatory Visit: Payer: Self-pay

## 2023-11-14 ENCOUNTER — Other Ambulatory Visit: Payer: Self-pay

## 2023-11-14 ENCOUNTER — Other Ambulatory Visit (HOSPITAL_COMMUNITY): Payer: Self-pay

## 2023-11-15 ENCOUNTER — Other Ambulatory Visit (HOSPITAL_COMMUNITY): Payer: Self-pay

## 2023-11-16 ENCOUNTER — Other Ambulatory Visit (HOSPITAL_COMMUNITY): Payer: Self-pay

## 2023-11-18 ENCOUNTER — Other Ambulatory Visit: Payer: Self-pay

## 2023-12-17 DIAGNOSIS — L570 Actinic keratosis: Secondary | ICD-10-CM | POA: Diagnosis not present

## 2023-12-17 DIAGNOSIS — D225 Melanocytic nevi of trunk: Secondary | ICD-10-CM | POA: Diagnosis not present

## 2023-12-17 DIAGNOSIS — D1801 Hemangioma of skin and subcutaneous tissue: Secondary | ICD-10-CM | POA: Diagnosis not present

## 2023-12-17 DIAGNOSIS — L821 Other seborrheic keratosis: Secondary | ICD-10-CM | POA: Diagnosis not present

## 2023-12-30 ENCOUNTER — Other Ambulatory Visit (HOSPITAL_COMMUNITY)

## 2024-01-14 ENCOUNTER — Other Ambulatory Visit (HOSPITAL_COMMUNITY): Payer: Self-pay

## 2024-01-14 ENCOUNTER — Other Ambulatory Visit: Payer: Self-pay

## 2024-01-14 ENCOUNTER — Telehealth: Admitting: Emergency Medicine

## 2024-01-14 DIAGNOSIS — J069 Acute upper respiratory infection, unspecified: Secondary | ICD-10-CM

## 2024-01-14 MED ORDER — IPRATROPIUM BROMIDE 0.03 % NA SOLN
2.0000 | Freq: Two times a day (BID) | NASAL | 0 refills | Status: AC
  Filled 2024-01-14: qty 30, 75d supply, fill #0

## 2024-01-14 NOTE — Progress Notes (Signed)
 We are sorry you are not feeling well.  Here is how we plan to help!  Based on what you have shared with me, it looks like you may have a viral upper respiratory infection.  Upper respiratory infections are caused by a large number of viruses; however, rhinovirus is the most common cause.   Symptoms vary from person to person, with common symptoms including sore throat, cough, and fatigue or lack of energy, and a feeling of general discomfort.  A low-grade fever of up to 100.4 may present, but is often uncommon.  Symptoms vary however, and are closely related to a person's age or underlying illnesses.  The most common symptoms associated with an upper respiratory infection are nasal discharge or congestion, cough, sneezing, headache and pressure in the ears and face.  These symptoms usually persist for about 3 to 10 days, but can last up to 2 weeks.  It is important to know that upper respiratory infections do not cause serious illness or complications in most cases.    Upper respiratory infections can be transmitted from person to person, with the most common method of transmission being a person's hands.  The virus is able to live on the skin and can infect other persons for up to 2 hours after direct contact.  Also, these can be transmitted when someone coughs or sneezes; thus, it is important to cover the mouth to reduce this risk.  To keep the spread of the illness at bay, good hand hygiene is very important!  Because this is a viral infection, there are no specific treatments other than to help you with the symptoms until the infection runs its course.  Antibiotics are not recommended by the Infectious Disease Society of [place] unless you have severe symptoms (including high fever) or you have symptoms for more than 10 days. If you still have symptoms after 10 days, antibiotics should be considered.    For nasal congestion, you may use an oral decongestants such as Mucinex D or if you have glaucoma  or high blood pressure use plain Mucinex.    Saline nasal spray or nasal drops can help and can safely be used as often as needed for congestion.  Try using saline irrigation, such as with a neti pot, several times a day while you are sick. Many neti pots come with salt packets premeasured to use to make saline. If you use your own salt, make sure it is kosher salt or sea salt (don't use table salt as it has iodine in it and you don't need that in your nose). Use distilled water to make saline. If you mix your own saline using your own salt, the recipe is 1/4 teaspoon salt in 1 cup warm water. Using saline irrigation can help prevent and treat sinus infections.   For your congestion, I have prescribed Ipratropium Bromide nasal spray 0.03% two sprays in each nostril 2-3 times a day  If you do not have a history of heart disease, hypertension, diabetes or thyroid  disease, prostate/bladder issues or glaucoma, you may also use Sudafed to treat nasal congestion.  It is highly recommended that you consult with a pharmacist or your primary care physician to ensure this medication is safe for you to take.     If you have a cough, you may use over-the-counter cough suppressants such as Delsym and Robitussin.  If you have glaucoma or high blood pressure, you can also use Coricidin HBP.     If you have  a sore or scratchy throat, use a saltwater gargle-  to  teaspoon of salt dissolved in a 4-ounce to 8-ounce glass of warm water.  Gargle the solution for approximately 15-30 seconds and then spit.  It is important not to swallow the solution.  You can also use throat lozenges/cough drops and Chloraseptic spray to help with throat pain or discomfort.  Warm or cold liquids can also be helpful in relieving throat pain.   For headache, pain, or general discomfort, you can use Ibuprofen or Tylenol  as directed.   Some authorities believe that zinc sprays or the use of Echinacea may shorten the course of your  symptoms.   HOME CARE Only take medications as instructed by your medical team. Be sure to drink plenty of fluids. Water is fine as well as fruit juices, sodas and electrolyte beverages. You may want to stay away from caffeine or alcohol. If you are nauseated, try taking small sips of liquids. How do you know if you are getting enough fluid? Your urine should be a pale yellow or almost colorless. Get rest. Taking a steamy shower or using a humidifier may help nasal congestion and ease sore throat pain. You can place a towel over your head and breathe in the steam from hot water coming from a faucet. Using a saline nasal spray works much the same way. Cough drops, hard candies and sore throat lozenges may ease your cough. Avoid close contacts especially the very young and the elderly Cover your mouth if you cough or sneeze Always remember to wash your hands.   GET HELP RIGHT AWAY IF: You develop worsening fever. If your symptoms do not improve within 10 days You develop yellow or green discharge from your nose over 3 days. You have coughing fits You develop a severe head ache or visual changes. You develop shortness of breath, difficulty breathing or start having chest pain Your symptoms persist after you have completed your treatment plan  MAKE SURE YOU  Understand these instructions. Will watch your condition. Will get help right away if you are not doing well or get worse.  Your e-visit answers were reviewed by a board certified advanced clinical practitioner to complete your personal care plan. Depending upon the condition, your plan could have included both over-the-counter or prescription medications.  Please review your pharmacy choice. If there is a problem, you may call our nursing hot line at and have the prescription routed to another pharmacy. Your safety is important to us . If you have drug allergies, check your prescription carefully.   You can use MyChart to ask  questions about todays visit, request a non-urgent call back, or ask for a work or school excuse for 24 hours related to this e-Visit. If it has been greater than 24 hours you will need to follow up with your provider, or enter a new e-Visit to address those concerns. You will get an e-mail in the next two days asking about your experience.  I hope that your e-visit has been valuable and will speed your recovery. Thank you for using e-visits.  I have spent 5 minutes in review of e-visit questionnaire, review and updating patient chart, medical decision making and response to patient.   Jon Belt, PhD, FNP-BC

## 2024-01-15 ENCOUNTER — Other Ambulatory Visit (HOSPITAL_COMMUNITY): Payer: Self-pay

## 2024-01-31 ENCOUNTER — Encounter (HOSPITAL_BASED_OUTPATIENT_CLINIC_OR_DEPARTMENT_OTHER): Payer: Self-pay

## 2024-01-31 ENCOUNTER — Ambulatory Visit (HOSPITAL_BASED_OUTPATIENT_CLINIC_OR_DEPARTMENT_OTHER): Admit: 2024-01-31 | Discharge: 2024-01-31 | Disposition: A | Discharge status: Home / Self Care | Admitting: Radiology

## 2024-01-31 ENCOUNTER — Ambulatory Visit (HOSPITAL_BASED_OUTPATIENT_CLINIC_OR_DEPARTMENT_OTHER)
Admission: EM | Admit: 2024-01-31 | Discharge: 2024-01-31 | Disposition: A | Discharge status: Home / Self Care | Attending: Family Medicine | Admitting: Family Medicine

## 2024-01-31 ENCOUNTER — Ambulatory Visit (HOSPITAL_BASED_OUTPATIENT_CLINIC_OR_DEPARTMENT_OTHER): Payer: Self-pay | Admitting: Family Medicine

## 2024-01-31 DIAGNOSIS — R110 Nausea: Secondary | ICD-10-CM

## 2024-01-31 DIAGNOSIS — R197 Diarrhea, unspecified: Secondary | ICD-10-CM | POA: Diagnosis not present

## 2024-01-31 DIAGNOSIS — R1011 Right upper quadrant pain: Secondary | ICD-10-CM | POA: Diagnosis not present

## 2024-01-31 NOTE — Progress Notes (Signed)
 X-rays are negative.  Patient updated.

## 2024-01-31 NOTE — Discharge Instructions (Addendum)
 Right upper quadrant abdominal pain, nausea and diarrhea: CBC with differential and CMP are pending.  Will adjust the plan of care, if needed once those test result.  Results will be available on the portal and we will also try to contact the patient with results when available.  Abdominal x-ray/KUB shows gas in the colon and some stool but is otherwise normal  Follow-up bland diet and get plenty of fluids.  Follow-up if symptoms do not improve, worsen or new symptoms occur.

## 2024-01-31 NOTE — ED Triage Notes (Signed)
 This week started with nauseous, diarrhea off and on Dull pain around his liver area, upper right quadrant  Denies fever

## 2024-01-31 NOTE — ED Provider Notes (Signed)
 " [facility]    CSN: [id number] Arrival date & time: 01/31/24  1306      History   Chief Complaint Chief Complaint  Patient presents with   Abdominal Pain   Nausea   Diarrhea    HPI Mark Meyer is a 44 y.o. male.   44 year old male who reports having nausea and diarrhea intermittently since 01/26/2024.  He has a dull pain in his, right upper abdominal area.  He has not vomited but has had persistent nausea and decreased appetite.  He denies fever and constipation.  He reports that he has averaged about 3 stools a day and they have been loose or liquid.  Some of the stools have been light but not really white or chalky.  He does not have a gallbladder.  He just has had intermittent right upper quadrant pain and he is concerned that he does not want to be sick and have to go to an ER because everything is closed.  So he decided to come to urgent care today.   Abdominal Pain Associated symptoms: diarrhea and nausea   Associated symptoms: no chest pain, no chills, no constipation, no cough, no dysuria, no fever, no hematuria, no sore throat and no vomiting   Diarrhea Associated symptoms: abdominal pain   Associated symptoms: no arthralgias, no chills, no fever and no vomiting     Past Medical History:  Diagnosis Date   History of kidney stones    Hypertension     Patient Active Problem List   Diagnosis Date Noted   Infraspinatus tendon tear, left, initial encounter 02/06/2018    Past Surgical History:  Procedure Laterality Date   CHOLECYSTECTOMY     EXTRACORPOREAL SHOCK WAVE LITHOTRIPSY Left 07/01/2017   Procedure: LEFT EXTRACORPOREAL SHOCK WAVE LITHOTRIPSY (ESWL);  Surgeon: Devere Lonni Righter, MD;  Location: [facility];  Service: Urology;  Laterality: Left;       Home Medications    Prior to Admission medications  Medication Sig Start Date End Date Taking? Authorizing Provider  cetirizine  (ZYRTEC ) 10 MG tablet Take 1 tablet (10 mg total) by mouth  daily for 30 days. 02/12/18 03/14/18  Leath-Warren, Etta PARAS, NP  diphenhydrAMINE  HCl (BENADRYL  ALLERGY PO) Take by mouth.    [provider]  famotidine  (PEPCID  AC) 10 MG tablet Take 2 tablets (20 mg total) by mouth daily for 15 days. 02/12/18 02/27/18  Leath-Warren, Etta PARAS, NP  fexofenadine (ALLEGRA) 60 MG tablet Take 60 mg by mouth 2 (two) times daily.    [provider]  ipratropium (ATROVENT ) 0.03 % nasal spray Place 2 sprays into both nostrils every 12 (twelve) hours. 01/14/24   Kabbe, Angela M, NP  latanoprost  (XALATAN ) 0.005 % ophthalmic solution Place 1 drop into both eyes at bedtime. 03/25/23     latanoprost  (XALATAN ) 0.005 % ophthalmic solution Place 1 drop into both eyes every evening. 11/12/23     lisinopril  (PRINIVIL ,ZESTRIL ) 10 MG tablet Take 5 mg by mouth daily.    [provider]  lisinopril  (ZESTRIL ) 10 MG tablet Take 1 tablet (10 mg total) by mouth daily. 07/08/23     predniSONE  (STERAPRED UNI-PAK 21 TAB) 10 MG (21) TBPK tablet Take as directed. 02/12/18   Leath-Warren, Etta PARAS, NP  tamsulosin  (FLOMAX ) 0.4 MG CAPS capsule Take 1 capsule (0.4 mg total) by mouth daily. 07/01/17   Carita Senior, MD    Family History History reviewed. No pertinent family history.  Social History Social History[1]   Allergies  Sulfa antibiotics   Review of Systems Review of Systems  Constitutional:  Negative for chills and fever.  HENT:  Negative for ear pain and sore throat.   Eyes:  Negative for pain and visual disturbance.  Respiratory:  Negative for cough.   Cardiovascular:  Negative for chest pain and palpitations.  Gastrointestinal:  Positive for abdominal pain, diarrhea and nausea. Negative for constipation and vomiting.  Genitourinary:  Negative for dysuria and hematuria.  Musculoskeletal:  Negative for arthralgias and back pain.  Skin:  Negative for color change and rash.  Neurological:  Negative for seizures and syncope.  All other systems  reviewed and are negative.    Physical Exam Triage Vital Signs ED Triage Vitals  Encounter Vitals Group     BP 01/31/24 1444 (!) (P) 149/88     Girls Systolic BP Percentile --      Girls Diastolic BP Percentile --      Boys Systolic BP Percentile --      Boys Diastolic BP Percentile --      Pulse Rate 01/31/24 1444 (P) 84     Resp 01/31/24 1444 (P) 16     Temp 01/31/24 1444 (P) 98.2 F (36.8 C)     Temp Source 01/31/24 1444 (P) Oral     SpO2 01/31/24 1444 (P) 95 %     Weight --      Height --      Head Circumference --      Peak Flow --      Pain Score 01/31/24 1442 3     Pain Loc --      Pain Education --      Exclude from Growth Chart --    No data found.  Updated Vital Signs BP (!) (P) 149/88 (BP Location: Right Arm)   Pulse (P) 84   Temp (P) 98.2 F (36.8 C) (Oral)   Resp (P) 16   SpO2 (P) 95%   Visual Acuity Right Eye Distance:   Left Eye Distance:   Bilateral Distance:    Right Eye Near:   Left Eye Near:    Bilateral Near:     Physical Exam Vitals and nursing note reviewed.  Constitutional:      General: He is not in acute distress.    Appearance: He is well-developed. He is not ill-appearing or toxic-appearing.  HENT:     Head: Normocephalic and atraumatic.     Right Ear: Hearing, tympanic membrane, ear canal and external ear normal.     Left Ear: Hearing, tympanic membrane, ear canal and external ear normal.     Nose: No congestion or rhinorrhea.     Right Sinus: No maxillary sinus tenderness or frontal sinus tenderness.     Left Sinus: No maxillary sinus tenderness or frontal sinus tenderness.     Mouth/Throat:     Lips: Pink.     Mouth: Mucous membranes are moist.     Pharynx: Uvula midline. No oropharyngeal exudate or posterior oropharyngeal erythema.     Tonsils: No tonsillar exudate.  Eyes:     Conjunctiva/sclera: Conjunctivae normal.     Pupils: Pupils are equal, round, and reactive to light.  Cardiovascular:     Rate and Rhythm:  Normal rate and regular rhythm.     Heart sounds: S1 normal and S2 normal. No murmur heard. Pulmonary:     Effort: Pulmonary effort is normal. No respiratory distress.     Breath sounds: Normal breath sounds. No decreased breath sounds,  wheezing, rhonchi or rales.  Abdominal:     General: Bowel sounds are normal.     Palpations: Abdomen is soft.     Tenderness: There is no abdominal tenderness.  Musculoskeletal:        General: No swelling.     Cervical back: Neck supple.  Lymphadenopathy:     Head:     Right side of head: No submental, submandibular, tonsillar, preauricular or posterior auricular adenopathy.     Left side of head: No submental, submandibular, tonsillar, preauricular or posterior auricular adenopathy.     Cervical: No cervical adenopathy.     Right cervical: No superficial cervical adenopathy.    Left cervical: No superficial cervical adenopathy.  Skin:    General: Skin is warm and dry.     Capillary Refill: Capillary refill takes less than 2 seconds.     Findings: No rash.  Neurological:     Mental Status: He is alert and oriented to person, place, and time.  Psychiatric:        Mood and Affect: Mood normal.      UC Treatments / Results  Labs (all labs ordered are listed, but only abnormal results are displayed) Labs Reviewed  CBC WITH DIFFERENTIAL/PLATELET  COMPREHENSIVE METABOLIC PANEL WITH GFR    EKG   Radiology No results found.  Procedures Procedures (including critical care time)  Medications Ordered in UC Medications - No data to display  Initial Impression / Assessment and Plan / UC Course  I have reviewed the triage vital signs and the nursing notes.  Pertinent labs & imaging results that were available during my care of the patient were reviewed by me and considered in my medical decision making (see chart for details).  Plan of Care (see discharge instructions for additional patient precautions and education): Right upper quadrant  abdominal pain, nausea and diarrhea: CBC with differential and CMP are pending.  Will adjust the plan of care, if needed once those test result.  Results will be available on the portal and we will also try to contact the patient with results when available.  Abdominal x-ray/KUB shows gas in the colon and some stool but is otherwise normal  Follow-up bland diet and get plenty of fluids.  Follow-up if symptoms do not improve, worsen or new symptoms occur.  I reviewed the plan of care with the patient and/or the patient's guardian.  The patient and/or guardian had time to ask questions and acknowledged that the questions were answered.  Final Clinical Impressions(s) / UC Diagnoses   Final diagnoses:  Nausea without vomiting  Right upper quadrant abdominal pain  Diarrhea, unspecified type     Discharge Instructions      Right upper quadrant abdominal pain, nausea and diarrhea: CBC with differential and CMP are pending.  Will adjust the plan of care, if needed once those test result.  Results will be available on the portal and we will also try to contact the patient with results when available.  Abdominal x-ray/KUB shows gas in the colon and some stool but is otherwise normal  Follow-up bland diet and get plenty of fluids.  Follow-up if symptoms do not improve, worsen or new symptoms occur.     ED Prescriptions   None    PDMP not reviewed this encounter.    [1]  Social History Tobacco Use   Smoking status: Never   Smokeless tobacco: Never  Vaping Use   Vaping status: Never Used  Substance Use Topics  Alcohol use: Never   Drug use: Never     Ival Domino, FNP 01/31/24 1541  "

## 2024-02-01 LAB — COMPREHENSIVE METABOLIC PANEL WITH GFR
ALT: 33 IU/L (ref 0–44)
AST: 20 IU/L (ref 0–40)
Albumin: 4.6 g/dL (ref 4.1–5.1)
Alkaline Phosphatase: 74 IU/L (ref 47–123)
BUN/Creatinine Ratio: 10 (ref 9–20)
BUN: 9 mg/dL (ref 6–24)
Bilirubin Total: 0.5 mg/dL (ref 0.0–1.2)
CO2: 24 mmol/L (ref 20–29)
Calcium: 9.5 mg/dL (ref 8.7–10.2)
Chloride: 103 mmol/L (ref 96–106)
Creatinine, Ser: 0.94 mg/dL (ref 0.76–1.27)
Globulin, Total: 2.3 g/dL (ref 1.5–4.5)
Glucose: 86 mg/dL (ref 70–99)
Potassium: 4.1 mmol/L (ref 3.5–5.2)
Sodium: 140 mmol/L (ref 134–144)
Total Protein: 6.9 g/dL (ref 6.0–8.5)
eGFR: 103 mL/min/1.73

## 2024-02-01 LAB — CBC WITH DIFFERENTIAL/PLATELET
Basophils Absolute: 0 x10E3/uL (ref 0.0–0.2)
Basos: 0 %
EOS (ABSOLUTE): 0.1 x10E3/uL (ref 0.0–0.4)
Eos: 1 %
Hematocrit: 47.5 % (ref 37.5–51.0)
Hemoglobin: 15.9 g/dL (ref 13.0–17.7)
Immature Grans (Abs): 0 x10E3/uL (ref 0.0–0.1)
Immature Granulocytes: 0 %
Lymphocytes Absolute: 1.5 x10E3/uL (ref 0.7–3.1)
Lymphs: 23 %
MCH: 30.2 pg (ref 26.6–33.0)
MCHC: 33.5 g/dL (ref 31.5–35.7)
MCV: 90 fL (ref 79–97)
Monocytes Absolute: 0.8 x10E3/uL (ref 0.1–0.9)
Monocytes: 11 %
Neutrophils Absolute: 4.4 x10E3/uL (ref 1.4–7.0)
Neutrophils: 65 %
Platelets: 353 x10E3/uL (ref 150–450)
RBC: 5.26 x10E6/uL (ref 4.14–5.80)
RDW: 13.5 % (ref 11.6–15.4)
WBC: 6.8 x10E3/uL (ref 3.4–10.8)

## 2024-02-03 NOTE — Progress Notes (Signed)
 Labs are all normal.  Patient updated.  Patient still having some nausea and abdominal pain.  Encouraged to follow-up with primary care.  He may need referral to gastroenterology.
